# Patient Record
Sex: Male | Born: 1970 | Race: White | Hispanic: No | Smoking: Former smoker
Health system: Southern US, Community
[De-identification: ages and names within clinical notes are randomized; demographics above are authoritative.]

## PROBLEM LIST (undated history)

## (undated) DIAGNOSIS — G4700 Insomnia, unspecified: Secondary | ICD-10-CM

## (undated) DIAGNOSIS — F32A Depression, unspecified: Secondary | ICD-10-CM

## (undated) DIAGNOSIS — F329 Major depressive disorder, single episode, unspecified: Secondary | ICD-10-CM

## (undated) HISTORY — PX: WISDOM TOOTH EXTRACTION: SHX21

## (undated) HISTORY — DX: Depression, unspecified: F32.A

## (undated) HISTORY — PX: NO PAST SURGERIES: SHX2092

## (undated) HISTORY — DX: Major depressive disorder, single episode, unspecified: F32.9

## (undated) HISTORY — DX: Insomnia, unspecified: G47.00

---

## 2005-08-12 ENCOUNTER — Ambulatory Visit: Payer: Self-pay | Admitting: Gastroenterology

## 2005-09-01 ENCOUNTER — Ambulatory Visit: Payer: Self-pay | Admitting: Gastroenterology

## 2005-09-10 ENCOUNTER — Ambulatory Visit: Payer: Self-pay | Admitting: Gastroenterology

## 2016-05-08 ENCOUNTER — Encounter: Payer: Self-pay | Admitting: Family Medicine

## 2016-05-08 ENCOUNTER — Ambulatory Visit (INDEPENDENT_AMBULATORY_CARE_PROVIDER_SITE_OTHER): Payer: BC Managed Care – PPO | Admitting: Family Medicine

## 2016-05-08 VITALS — BP 116/76 | HR 76 | Temp 98.2°F | Resp 16 | Ht 72.0 in | Wt 278.0 lb

## 2016-05-08 DIAGNOSIS — F329 Major depressive disorder, single episode, unspecified: Secondary | ICD-10-CM | POA: Diagnosis not present

## 2016-05-08 DIAGNOSIS — F5101 Primary insomnia: Secondary | ICD-10-CM

## 2016-05-08 DIAGNOSIS — Z0001 Encounter for general adult medical examination with abnormal findings: Secondary | ICD-10-CM | POA: Insufficient documentation

## 2016-05-08 DIAGNOSIS — Z0000 Encounter for general adult medical examination without abnormal findings: Secondary | ICD-10-CM

## 2016-05-08 DIAGNOSIS — F419 Anxiety disorder, unspecified: Secondary | ICD-10-CM

## 2016-05-08 DIAGNOSIS — G4700 Insomnia, unspecified: Secondary | ICD-10-CM | POA: Insufficient documentation

## 2016-05-08 DIAGNOSIS — F32A Depression, unspecified: Secondary | ICD-10-CM | POA: Insufficient documentation

## 2016-05-08 LAB — LIPID PANEL
CHOLESTEROL: 166 mg/dL (ref 0–200)
HDL: 35.5 mg/dL — ABNORMAL LOW (ref >39.00)
LDL CALC: 104 mg/dL — AB (ref 0–99)
NONHDL: 130.4
Total CHOL/HDL Ratio: 5
Triglycerides: 132 mg/dL (ref 0.0–149.0)
VLDL: 26.4 mg/dL (ref 0.0–40.0)

## 2016-05-08 LAB — HEMOGLOBIN A1C: HEMOGLOBIN A1C: 5.5 % (ref 4.6–6.5)

## 2016-05-08 LAB — COMPREHENSIVE METABOLIC PANEL
ALBUMIN: 4.4 g/dL (ref 3.5–5.2)
ALK PHOS: 56 U/L (ref 39–117)
ALT: 67 U/L — ABNORMAL HIGH (ref 0–53)
AST: 32 U/L (ref 0–37)
BUN: 9 mg/dL (ref 6–23)
CHLORIDE: 104 meq/L (ref 96–112)
CO2: 30 mEq/L (ref 19–32)
CREATININE: 0.9 mg/dL (ref 0.40–1.50)
Calcium: 9.6 mg/dL (ref 8.4–10.5)
GFR: 97.36 mL/min (ref >60.00)
GLUCOSE: 90 mg/dL (ref 70–99)
POTASSIUM: 4.8 meq/L (ref 3.5–5.1)
SODIUM: 139 meq/L (ref 135–145)
TOTAL PROTEIN: 6.6 g/dL (ref 6.0–8.3)
Total Bilirubin: 0.5 mg/dL (ref 0.2–1.2)

## 2016-05-08 LAB — CBC
HCT: 45.7 % (ref 39.0–52.0)
Hemoglobin: 15.5 g/dL (ref 13.0–17.0)
MCHC: 33.8 g/dL (ref 30.0–36.0)
MCV: 88.6 fl (ref 78.0–100.0)
PLATELETS: 232 10*3/uL (ref 150.0–400.0)
RBC: 5.16 Mil/uL (ref 4.22–5.81)
RDW: 12.9 % (ref 11.5–15.5)
WBC: 6.4 10*3/uL (ref 4.0–10.5)

## 2016-05-08 LAB — TSH: TSH: 0.86 u[IU]/mL (ref 0.35–4.50)

## 2016-05-08 MED ORDER — TRAZODONE HCL 50 MG PO TABS: ORAL_TABLET | ORAL | 3 refills | Status: DC

## 2016-05-08 MED ORDER — ESCITALOPRAM OXALATE 20 MG PO TABS: 30.0000 mg | ORAL_TABLET | Freq: Every day | ORAL | 3 refills | Status: DC

## 2016-05-08 NOTE — Progress Notes (Signed)
Subjective:  Patient ID: KHA ORBIN, male    DOB: [birth year]  Age: 45 y.o. MRN: [id number]  CC: Establish care, physical  HPI Connor Byrd Byrd is a 45 y.o. male presents to the clinic today to establish care. He is in need of a physical exam.  Preventative Healthcare  Immunizations  Tetanus - Unsure.   Pneumococcal - Not indicated. Has quit smoking.  Flu - Declines.   Labs: Screening labs today.  Exercise: Reports regular exercise. Has gained weight after quitting smoking.  Alcohol use: See below.  Smoking/tobacco use: Former smoker.   Regular dental exams: Yes.   Wears seat belt: Yes.   PMH, Surgical Hx, Family Hx, Social History reviewed and updated as below.  Past Medical History:  Diagnosis Date  . Depression   . Insomnia    Past Surgical History:  Procedure Laterality Date  . NO PAST SURGERIES     Family History  Problem Relation Age of Onset  . Alcohol abuse Mother   . Arthritis Mother   . Mental illness Mother   . Hyperlipidemia Father   . Hypertension Father   . Diabetes Father   . Mental illness Maternal Grandmother   . Diabetes Maternal Grandfather   . Alcohol abuse Paternal Grandfather    Social History  Substance Use Topics  . Smoking status: Former Smoker    Quit date: 01/03/2016  . Smokeless tobacco: Never Used  . Alcohol use 1.2 - 2.4 oz/week    2 - 4 Standard drinks or equivalent per week   Review of Systems General: Denies unexplained weight loss, fever. Skin: Denies new or changing mole, sore/wound that won't heal. ENT: Trouble hearing, ringing in the ears, sores in the mouth, hoarseness, trouble swallowing. Eyes: Denies trouble seeing/visual disturbance. Heart/CV: Denies chest pain, shortness of breath, edema, palpitations. Lungs/Resp: Denies cough, shortness of breath, hemoptysis. Abd/GI: Denies nausea, vomiting, diarrhea, constipation, abdominal pain, hematochezia, melena. GU: Denies dysuria, incontinence, hematuria,  urinary frequency, difficulty starting/keeping stream, penile discharge, sexual difficulty, lump in testicles. MSK: Denies joint pain/swelling, myalgias. Neuro: Denies headaches, weakness, numbness, dizziness, syncope. Psych: Denies sadness, anxiety, stress, memory difficulty. Endocrine: Denies polyuria and polydipsia.  Objective:   Today's Vitals: BP 116/76 (BP Location: Left Arm, Patient Position: Sitting, Cuff Size: Large)   Pulse 76   Temp 98.2 F (36.8 C) (Oral)   Resp 16   Ht 6' (1.829 m) Comment: with shoes  Wt 278 lb (126.1 kg)   SpO2 96%   BMI 37.70 kg/m   Physical Exam  Constitutional: He is oriented to person, place, and time. He appears well-developed and well-nourished. No distress.  Obese.  HENT:  Head: Normocephalic and atraumatic.  Nose: Nose normal.  Mouth/Throat: Oropharynx is clear and moist. No oropharyngeal exudate.  Normal TM's bilaterally.   Eyes: Conjunctivae are normal. No scleral icterus.  Neck: Neck supple.  Cardiovascular: Normal rate and regular rhythm.   No murmur heard. Pulmonary/Chest: Effort normal and breath sounds normal. He has no wheezes. He has no rales.  Abdominal: Soft. He exhibits no distension. There is no tenderness. There is no rebound and no guarding.  Musculoskeletal: Normal range of motion. He exhibits no edema.  Lymphadenopathy:    He has no cervical adenopathy.  Neurological: He is alert and oriented to person, place, and time.  Skin: Skin is warm and dry. No rash noted.  Psychiatric: He has a normal mood and affect.  Vitals reviewed.  Assessment & Plan:   Problem List Items  Addressed This Visit    Insomnia   Relevant Medications   traZODone (DESYREL) 50 MG tablet   Depression   Relevant Medications   traZODone (DESYREL) 50 MG tablet   escitalopram (LEXAPRO) 20 MG tablet   Annual physical exam - Primary    Unsure of tetanus. Declines influenza. Screening labs today. Advised regular exercise and dietary changes.  Advised increased protein and tobacco intake and decreased carb intake. Maintenance medications refill today.      Relevant Orders   CBC   Hemoglobin A1c   Comprehensive metabolic panel   Lipid panel   TSH      Outpatient Encounter Prescriptions as of 05/08/2016  Medication Sig  . escitalopram (LEXAPRO) 20 MG tablet Take 1.5 tablets (30 mg total) by mouth daily.  Marland Kitchen OVER THE COUNTER MEDICATION   . traZODone (DESYREL) 50 MG tablet 1-3 tablets as needed nightly for sleep.  . [DISCONTINUED] escitalopram (LEXAPRO) 20 MG tablet Take 20 mg by mouth daily. Take one and one-half tablets daily  . [DISCONTINUED] traZODone (DESYREL) 50 MG tablet Take 50 mg by mouth 3 (three) times daily. Take one to three tablets at bedtime.   No facility-administered encounter medications on file as of 05/08/2016.     Follow-up: Annually  [name]

## 2016-05-08 NOTE — Patient Instructions (Signed)
Follow up annually.  Diet and exercise as we discussed.  Take care  Dr. Lacinda Axon  Health Maintenance, Male A healthy lifestyle and preventative care can promote health and wellness.  Maintain regular health, dental, and eye exams.  Eat a healthy diet. Foods like vegetables, fruits, whole grains, low-fat dairy products, and lean protein foods contain the nutrients you need and are low in calories. Decrease your intake of foods high in solid fats, added sugars, and salt. Get information about a proper diet from your health care provider, if necessary.  Regular physical exercise is one of the most important things you can do for your health. Most adults should get at least 150 minutes of moderate-intensity exercise (any activity that increases your heart rate and causes you to sweat) each week. In addition, most adults need muscle-strengthening exercises on 2 or more days a week.   Maintain a healthy weight. The body mass index (BMI) is a screening tool to identify possible weight problems. It provides an estimate of body fat based on height and weight. Your health care provider can find your BMI and can help you achieve or maintain a healthy weight. For males 20 years and older:  A BMI below 18.5 is considered underweight.  A BMI of 18.5 to 24.9 is normal.  A BMI of 25 to 29.9 is considered overweight.  A BMI of 30 and above is considered obese.  Maintain normal blood lipids and cholesterol by exercising and minimizing your intake of saturated fat. Eat a balanced diet with plenty of fruits and vegetables. Blood tests for lipids and cholesterol should begin at age 75 and be repeated every 5 years. If your lipid or cholesterol levels are high, you are over age 50, or you are at high risk for heart disease, you may need your cholesterol levels checked more frequently.Ongoing high lipid and cholesterol levels should be treated with medicines if diet and exercise are not working.  If you smoke,  find out from your health care provider how to quit. If you do not use tobacco, do not start.  Lung cancer screening is recommended for adults aged 89-80 years who are at high risk for developing lung cancer because of a history of smoking. A yearly low-dose CT scan of the lungs is recommended for people who have at least a 30-pack-year history of smoking and are current smokers or have quit within the past 15 years. A pack year of smoking is smoking an average of 1 pack of cigarettes a day for 1 year (for example, a 30-pack-year history of smoking could mean smoking 1 pack a day for 30 years or 2 packs a day for 15 years). Yearly screening should continue until the smoker has stopped smoking for at least 15 years. Yearly screening should be stopped for people who develop a health problem that would prevent them from having lung cancer treatment.  If you choose to drink alcohol, do not have more than 2 drinks per day. One drink is considered to be 12 oz (360 mL) of beer, 5 oz (150 mL) of wine, or 1.5 oz (45 mL) of liquor.  Avoid the use of street drugs. Do not share needles with anyone. Ask for help if you need support or instructions about stopping the use of drugs.  High blood pressure causes heart disease and increases the risk of stroke. High blood pressure is more likely to develop in:  People who have blood pressure in the end of the normal range (  100-139/85-89 mm Hg).  People who are overweight or obese.  People who are African American.  If you are 40-59 years of age, have your blood pressure checked every 3-5 years. If you are 27 years of age or older, have your blood pressure checked every year. You should have your blood pressure measured twice-once when you are at a hospital or clinic, and once when you are not at a hospital or clinic. Record the average of the two measurements. To check your blood pressure when you are not at a hospital or clinic, you can use:  An automated blood  pressure machine at a pharmacy.  A home blood pressure monitor.  If you are 35-82 years old, ask your health care provider if you should take aspirin to prevent heart disease.  Diabetes screening involves taking a blood sample to check your fasting blood sugar level. This should be done once every 3 years after age 18 if you are at a normal weight and without risk factors for diabetes. Testing should be considered at a younger age or be carried out more frequently if you are overweight and have at least 1 risk factor for diabetes.  Colorectal cancer can be detected and often prevented. Most routine colorectal cancer screening begins at the age of 59 and continues through age 71. However, your health care provider may recommend screening at an earlier age if you have risk factors for colon cancer. On a yearly basis, your health care provider may provide home test kits to check for hidden blood in the stool. A small camera at the end of a tube may be used to directly examine the colon (sigmoidoscopy or colonoscopy) to detect the earliest forms of colorectal cancer. Talk to your health care provider about this at age 53 when routine screening begins. A direct exam of the colon should be repeated every 5-10 years through age 51, unless early forms of precancerous polyps or small growths are found.  People who are at an increased risk for hepatitis B should be screened for this virus. You are considered at high risk for hepatitis B if:  You were born in a country where hepatitis B occurs often. Talk with your health care provider about which countries are considered high risk.  Your parents were born in a high-risk country and you have not received a shot to protect against hepatitis B (hepatitis B vaccine).  You have HIV or AIDS.  You use needles to inject street drugs.  You live with, or have sex with, someone who has hepatitis B.  You are a man who has sex with other men (MSM).  You get  hemodialysis treatment.  You take certain medicines for conditions like cancer, organ transplantation, and autoimmune conditions.  Hepatitis C blood testing is recommended for all people born from [birth year] through 1964 and any individual with known risk factors for hepatitis C.  Healthy men should no longer receive prostate-specific antigen (PSA) blood tests as part of routine cancer screening. Talk to your health care provider about prostate cancer screening.  Testicular cancer screening is not recommended for adolescents or adult males who have no symptoms. Screening includes self-exam, a health care provider exam, and other screening tests. Consult with your health care provider about any symptoms you have or any concerns you have about testicular cancer.  Practice safe sex. Use condoms and avoid high-risk sexual practices to reduce the spread of sexually transmitted infections (STIs).  You should be screened for STIs, including  gonorrhea and chlamydia if:  You are sexually active and are younger than 24 years.  You are older than 24 years, and your health care provider tells you that you are at risk for this type of infection.  Your sexual activity has changed since you were last screened, and you are at an increased risk for chlamydia or gonorrhea. Ask your health care provider if you are at risk.  If you are at risk of being infected with HIV, it is recommended that you take a prescription medicine daily to prevent HIV infection. This is called pre-exposure prophylaxis (PrEP). You are considered at risk if:  You are a man who has sex with other men (MSM).  You are a heterosexual man who is sexually active with multiple partners.  You take drugs by injection.  You are sexually active with a partner who has HIV.  Talk with your health care provider about whether you are at high risk of being infected with HIV. If you choose to begin PrEP, you should first be tested for HIV. You should  then be tested every 3 months for as long as you are taking PrEP.  Use sunscreen. Apply sunscreen liberally and repeatedly throughout the day. You should seek shade when your shadow is shorter than you. Protect yourself by wearing long sleeves, pants, a wide-brimmed hat, and sunglasses year round whenever you are outdoors.  Tell your health care provider of new moles or changes in moles, especially if there is a change in shape or color. Also, tell your health care provider if a mole is larger than the size of a pencil eraser.  A one-time screening for abdominal aortic aneurysm (AAA) and surgical repair of large AAAs by ultrasound is recommended for men aged 33-75 years who are current or former smokers.  Stay current with your vaccines (immunizations). This information is not intended to replace advice given to you by your health care provider. Make sure you discuss any questions you have with your health care provider. Document Released: 11/22/2007 Document Revised: 06/16/2014 Document Reviewed: 02/27/2015 Elsevier Interactive Patient Education  2017 [employer].

## 2016-05-08 NOTE — Assessment & Plan Note (Signed)
Unsure of tetanus. Declines influenza. Screening labs today. Advised regular exercise and dietary changes. Advised increased protein and tobacco intake and decreased carb intake. Maintenance medications refill today.

## 2016-05-29 ENCOUNTER — Telehealth: Payer: Self-pay

## 2016-05-29 ENCOUNTER — Other Ambulatory Visit: Payer: Self-pay | Admitting: Family Medicine

## 2016-05-29 DIAGNOSIS — R7989 Other specified abnormal findings of blood chemistry: Secondary | ICD-10-CM

## 2016-05-29 DIAGNOSIS — R945 Abnormal results of liver function studies: Principal | ICD-10-CM

## 2016-05-29 NOTE — Telephone Encounter (Signed)
Pt coming for repeat labs 05/30/16. Please place future orders. Thank you.  

## 2016-05-30 ENCOUNTER — Other Ambulatory Visit (INDEPENDENT_AMBULATORY_CARE_PROVIDER_SITE_OTHER): Payer: BC Managed Care – PPO

## 2016-05-30 DIAGNOSIS — R945 Abnormal results of liver function studies: Principal | ICD-10-CM

## 2016-05-30 DIAGNOSIS — R7989 Other specified abnormal findings of blood chemistry: Secondary | ICD-10-CM | POA: Diagnosis not present

## 2016-05-30 LAB — HEPATIC FUNCTION PANEL
ALBUMIN: 4.7 g/dL (ref 3.5–5.2)
ALK PHOS: 55 U/L (ref 39–117)
ALT: 75 U/L — ABNORMAL HIGH (ref 0–53)
AST: 36 U/L (ref 0–37)
Bilirubin, Direct: 0.1 mg/dL (ref 0.0–0.3)
TOTAL PROTEIN: 7.2 g/dL (ref 6.0–8.3)
Total Bilirubin: 0.6 mg/dL (ref 0.2–1.2)

## 2016-06-03 ENCOUNTER — Telehealth: Payer: Self-pay | Admitting: *Deleted

## 2016-06-03 NOTE — Telephone Encounter (Signed)
Pt requested lab results Pt contact [phone]

## 2016-06-03 NOTE — Telephone Encounter (Signed)
Notified patient.

## 2016-06-06 ENCOUNTER — Other Ambulatory Visit: Payer: Self-pay | Admitting: Family Medicine

## 2016-06-06 DIAGNOSIS — R748 Abnormal levels of other serum enzymes: Secondary | ICD-10-CM

## 2016-06-11 ENCOUNTER — Other Ambulatory Visit: Payer: Self-pay | Admitting: Family Medicine

## 2016-06-11 DIAGNOSIS — R748 Abnormal levels of other serum enzymes: Secondary | ICD-10-CM

## 2016-06-17 ENCOUNTER — Ambulatory Visit
Admission: RE | Admit: 2016-06-17 | Discharge: 2016-06-17 | Disposition: A | Discharge status: Home / Self Care | Payer: BC Managed Care – PPO | Source: Ambulatory Visit | Attending: Family Medicine | Admitting: Family Medicine

## 2016-06-17 DIAGNOSIS — R748 Abnormal levels of other serum enzymes: Secondary | ICD-10-CM | POA: Insufficient documentation

## 2016-06-17 DIAGNOSIS — K769 Liver disease, unspecified: Secondary | ICD-10-CM | POA: Diagnosis not present

## 2016-06-18 ENCOUNTER — Telehealth: Payer: Self-pay | Admitting: Family Medicine

## 2016-06-18 NOTE — Telephone Encounter (Signed)
Pt called back in regards to his imaging results. Thank you!  Call pt @ [phone]

## 2016-06-19 NOTE — Telephone Encounter (Signed)
Pt called and was given results. He will callback in regards to CT or MRI.

## 2016-12-01 ENCOUNTER — Other Ambulatory Visit: Payer: Self-pay | Admitting: Family Medicine

## 2016-12-01 DIAGNOSIS — F5101 Primary insomnia: Secondary | ICD-10-CM

## 2017-01-12 ENCOUNTER — Encounter: Payer: Self-pay | Admitting: Family Medicine

## 2017-04-23 ENCOUNTER — Other Ambulatory Visit: Payer: Self-pay | Admitting: Family Medicine

## 2017-04-23 DIAGNOSIS — F5101 Primary insomnia: Secondary | ICD-10-CM

## 2017-05-14 ENCOUNTER — Encounter: Payer: BC Managed Care – PPO | Admitting: Family Medicine

## 2017-05-18 IMAGING — US US ABDOMEN LIMITED
1 series · 15 of 25 positions shown · non-contrast
Comparison: The gallbladder is visualized and no gallstones are
noted. There is no pain over the gallbladder with compression.

CLINICAL DATA: Elevated liver function tests

EXAM:
US ABDOMEN LIMITED - RIGHT UPPER QUADRANT

[Series 1: us abdomen limited · 15 of 63 slices shown]
[im 1/63]
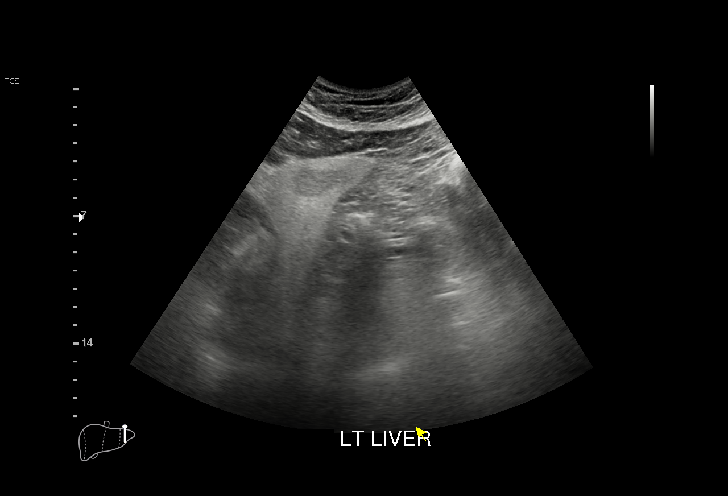
[im 6/63]
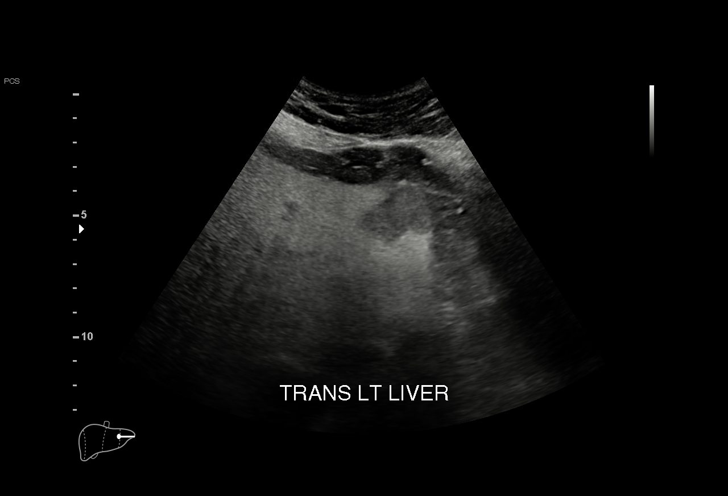
[im 11/63]
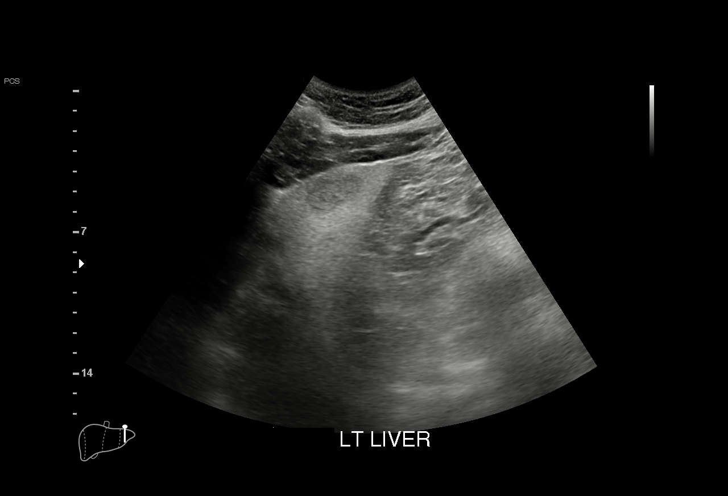
[im 13/63]
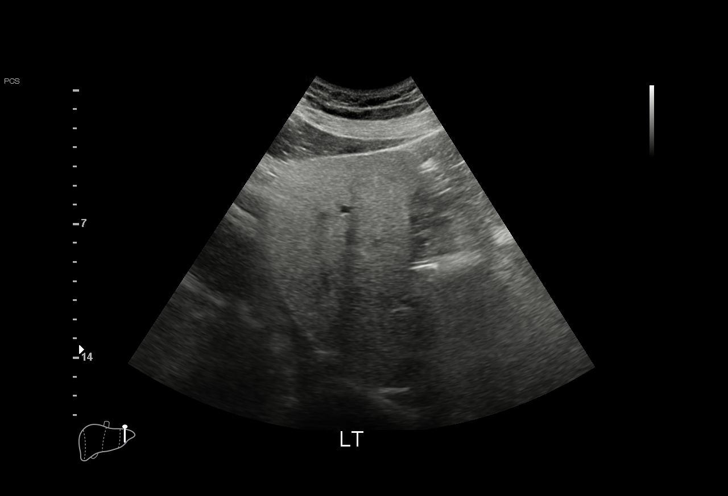
[im 19/63]
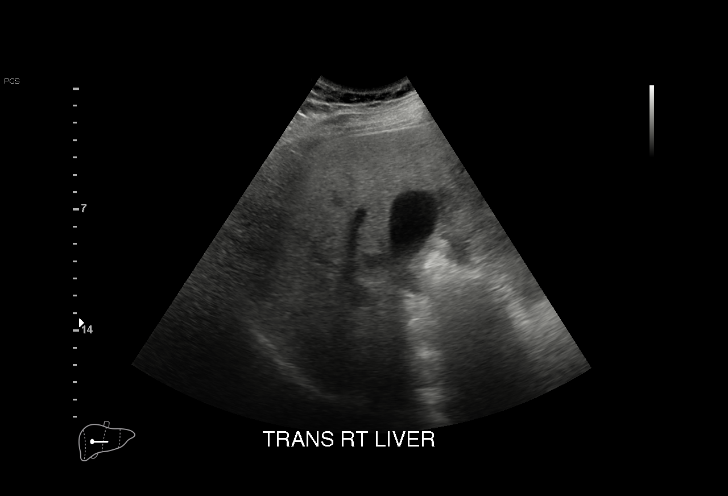
[im 24/63]
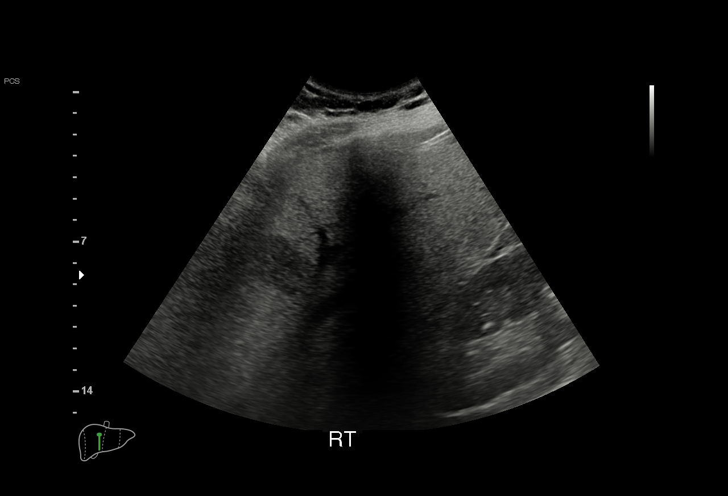
[im 26/63]
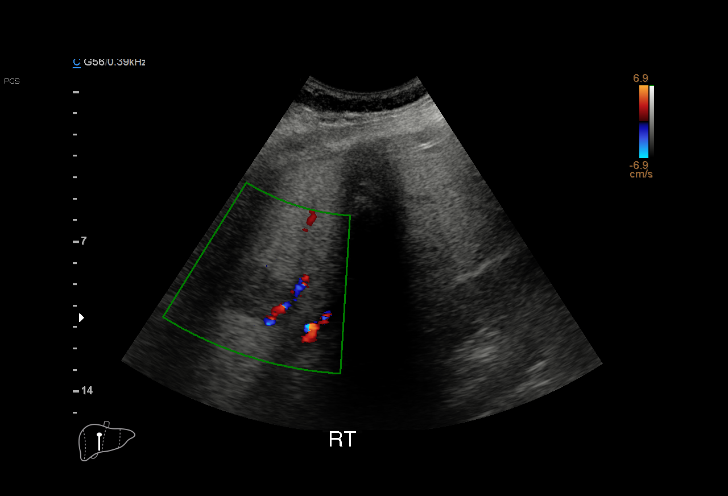
[im 32/63]
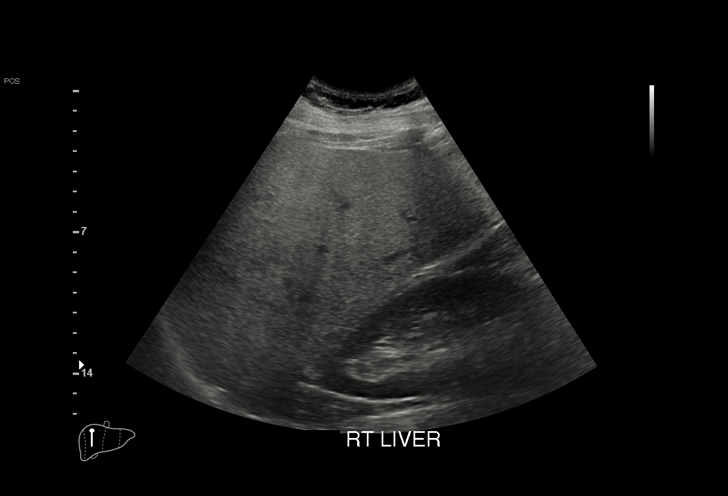
[im 37/63]
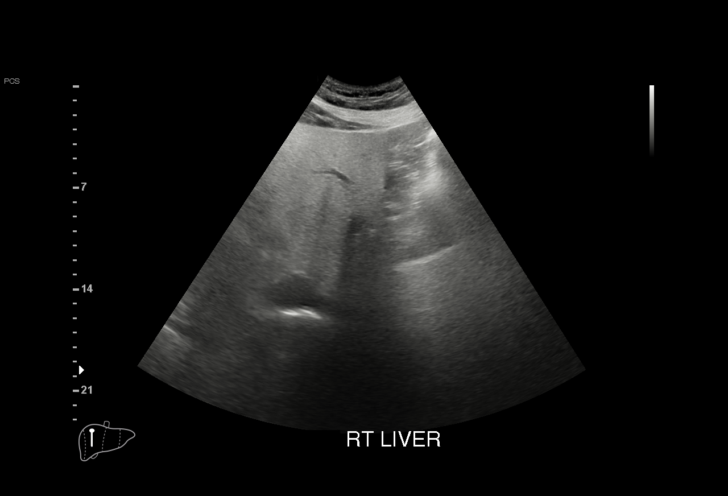
[im 39/63]
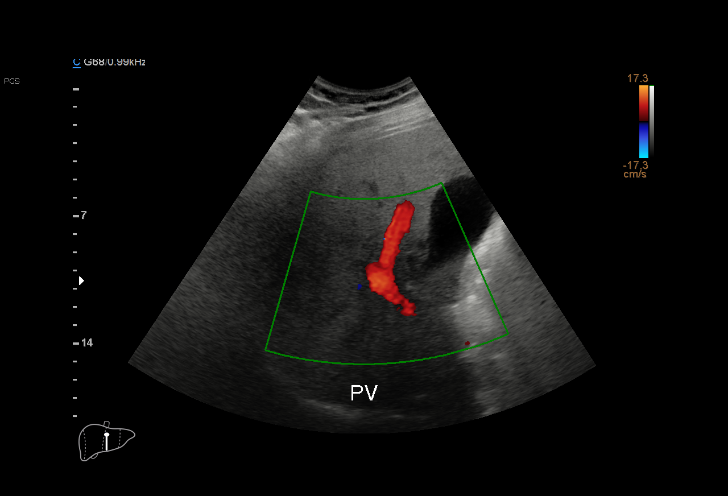
[im 44/63]
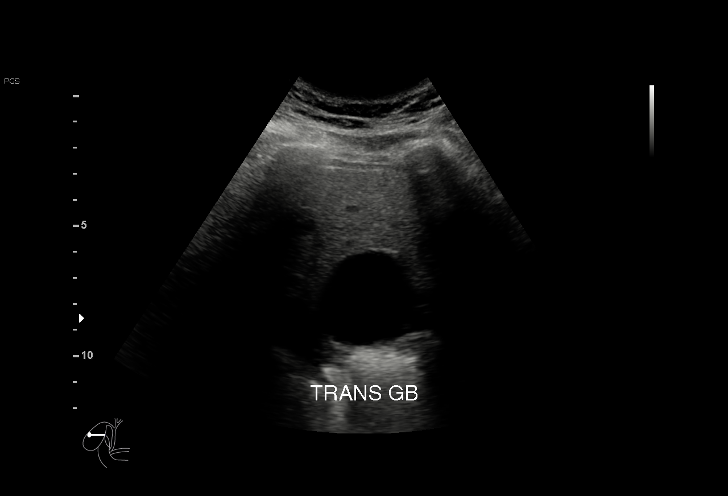
[im 50/63]
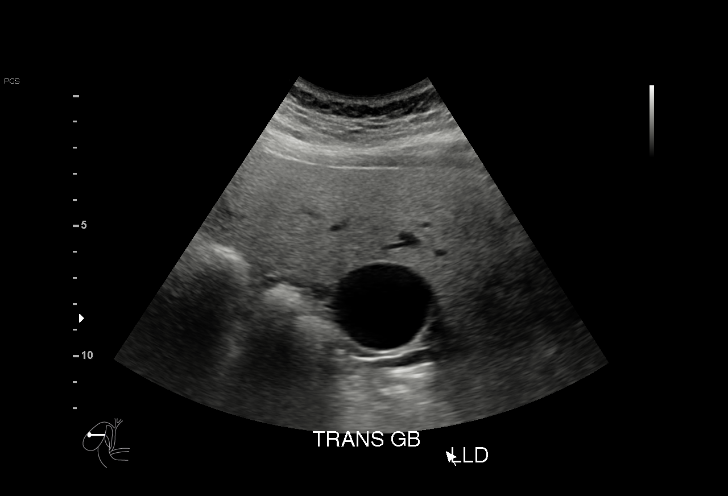
[im 52/63]
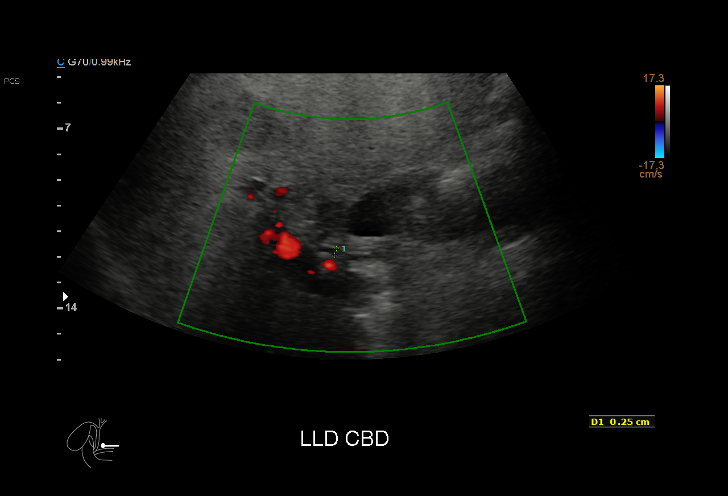
[im 57/63]
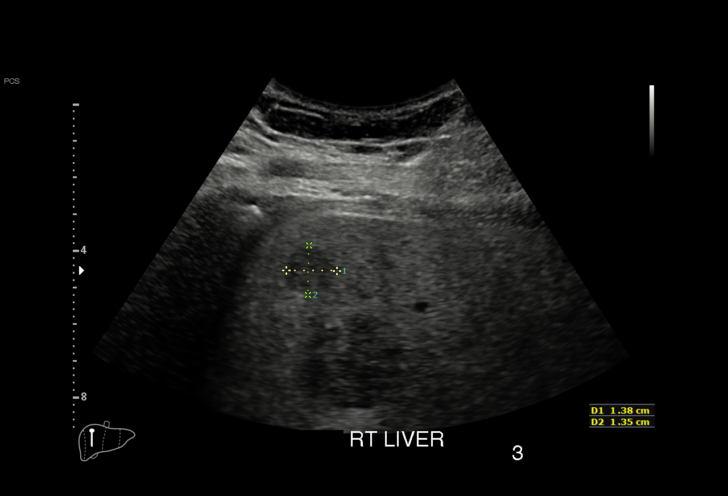
[im 63/63]
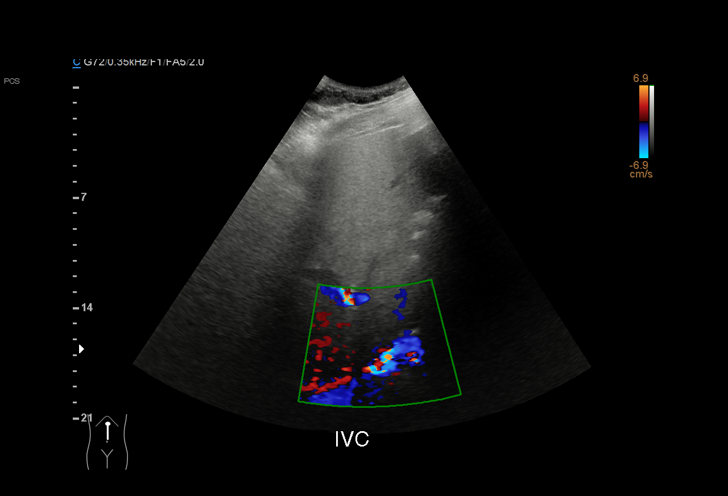

[15 of 25 positions shown; findings below may reference images not displayed]

FINDINGS: Gallbladder:

The gallbladder is well visualized and no gallstones are noted.
There is no pain over the gallbladder with compression.

Common bile duct:

Diameter: The common bile duct is normal measuring 2.7 mm in
diameter.

Liver:

The liver is diffusely echogenic and inhomogeneous consistent with
fatty infiltration. There are 3 areas however which are relatively
hypoechoic to the adjacent hepatic parenchyma. In the left lobe a
lesion measures 3.3 x 1.7 x 4.1 cm. In the right lobe there are 2
such lesions one measuring 3.6 x 2.5 x 3.4 cm, and a second lesion
measuring 1.4 x 1.4 x 1.5 cm. These could represent areas of fatty
sparing, but discrete hepatic lesions cannot be excluded. CT of the
abdomen without and with contrast or MRI of the abdomen is
recommended to assess the liver in more detail..
IMPRESSION: 1. Three relatively hypoechoic liver lesions compared to the
inhomogeneous echogenic liver parenchyma. These could represent
fatty sparing in the background of diffuse fatty infiltration, but
distinct liver lesions cannot be excluded. Recommend CT or MRI of
the liver to assess further.
2. No gallstones.

## 2017-05-27 ENCOUNTER — Encounter: Payer: Self-pay | Admitting: Family Medicine

## 2017-05-27 ENCOUNTER — Other Ambulatory Visit: Payer: Self-pay

## 2017-05-27 ENCOUNTER — Ambulatory Visit (INDEPENDENT_AMBULATORY_CARE_PROVIDER_SITE_OTHER): Payer: BC Managed Care – PPO | Admitting: Family Medicine

## 2017-05-27 VITALS — BP 128/80 | HR 73 | Temp 98.3°F | Ht 72.0 in | Wt 280.0 lb

## 2017-05-27 DIAGNOSIS — F32A Depression, unspecified: Secondary | ICD-10-CM

## 2017-05-27 DIAGNOSIS — F5101 Primary insomnia: Secondary | ICD-10-CM

## 2017-05-27 DIAGNOSIS — Z23 Encounter for immunization: Secondary | ICD-10-CM

## 2017-05-27 DIAGNOSIS — F329 Major depressive disorder, single episode, unspecified: Secondary | ICD-10-CM | POA: Diagnosis not present

## 2017-05-27 DIAGNOSIS — Z0001 Encounter for general adult medical examination with abnormal findings: Secondary | ICD-10-CM | POA: Diagnosis not present

## 2017-05-27 DIAGNOSIS — E669 Obesity, unspecified: Secondary | ICD-10-CM | POA: Diagnosis not present

## 2017-05-27 DIAGNOSIS — Z13220 Encounter for screening for lipoid disorders: Secondary | ICD-10-CM

## 2017-05-27 DIAGNOSIS — N402 Nodular prostate without lower urinary tract symptoms: Secondary | ICD-10-CM | POA: Diagnosis not present

## 2017-05-27 DIAGNOSIS — N529 Male erectile dysfunction, unspecified: Secondary | ICD-10-CM | POA: Insufficient documentation

## 2017-05-27 MED ORDER — TRAZODONE HCL 50 MG PO TABS: ORAL_TABLET | ORAL | 3 refills | Status: DC

## 2017-05-27 MED ORDER — ESCITALOPRAM OXALATE 20 MG PO TABS: 30.0000 mg | ORAL_TABLET | Freq: Every day | ORAL | 3 refills | Status: DC

## 2017-05-27 NOTE — Assessment & Plan Note (Signed)
Physical exam completed.  Encouraged diet and exercise.  He will return for fasting lab work.  Tetanus vaccination and flu vaccination given to patient.

## 2017-05-27 NOTE — Patient Instructions (Signed)
Nice to see you. Please contact your insurance company to ensure that they will cover the referral to urology for the prostate nodule with a code of N40.2.  This code will also be used for a PSA.  We are going to check a testosterone as well for erectile dysfunction with a code of N52.9.  We will check a lipid panel and CMP with a code of Z13.220. Please work on diet and exercise.

## 2017-05-27 NOTE — Assessment & Plan Note (Signed)
Prostate exam completed given erectile dysfunction issues.  Possible nodule noted in the right side of the prostate.  We will check a PSA.  We will refer to urology.  I advised the patient to check with his insurance to ensure that they will cover this given he has concerns regarding this.

## 2017-05-27 NOTE — Assessment & Plan Note (Signed)
Asymptomatic.  No SI.  Refill Lexapro.

## 2017-05-27 NOTE — Assessment & Plan Note (Signed)
No obvious genital abnormalities.  We will check a testosterone level given that he is relatively young for erectile issues.  We will also refer to urology given possible prostate nodule.

## 2017-05-27 NOTE — Progress Notes (Signed)
Tommi Rumps, MD Phone: [phone]  Connor Byrd is a 46 y.o. male who presents today for follow-up.  Not exercising very much.  He plans to start at the first of the year with walking. Is going to work on diet as well.  He has been very unhealthy recently. HIV testing done 15 years ago.  He is due for tetanus vaccination and flu shot. No family history of prostate cancer or colon cancer. Quit smoking 2 years ago after smoking 1.5-2 packs/day for 30 years.  Rare alcohol use.  No illicit drug use.  Patient reports some issues with sexual dysfunction.  His erections are not as direct as prior and do not last as long.  His nighttime erections are similar.  He does ejaculate okay.  Insomnia: Patient notes he sleeps very well with the trazodone.  He typically takes it nightly.  No drowsiness the next day.  Depression: He notes no depressive symptoms or anxiety symptoms.  The Lexapro has been very beneficial.  No SI.  Active Ambulatory Problems    Diagnosis Date Noted  . Insomnia   . Depression   . Encounter for general adult medical examination with abnormal findings 05/08/2016  . Prostate nodule 05/27/2017  . Erectile dysfunction 05/27/2017   Resolved Ambulatory Problems    Diagnosis Date Noted  . No Resolved Ambulatory Problems   Past Medical History:  Diagnosis Date  . Depression   . Insomnia     Family History  Problem Relation Age of Onset  . Alcohol abuse Mother   . Arthritis Mother   . Mental illness Mother   . Hyperlipidemia Father   . Hypertension Father   . Diabetes Father   . Mental illness Maternal Grandmother   . Diabetes Maternal Grandfather   . Alcohol abuse Paternal Grandfather     Social History   Socioeconomic History  . Marital status: Married    Spouse name: Not on file  . Number of children: Not on file  . Years of education: Not on file  . Highest education level: Not on file  Social Needs  . Financial resource strain: Not on file    . Food insecurity - worry: Not on file  . Food insecurity - inability: Not on file  . Transportation needs - medical: Not on file  . Transportation needs - non-medical: Not on file  Occupational History  . Not on file  Tobacco Use  . Smoking status: Former Smoker    Last attempt to quit: 01/03/2016    Years since quitting: 1.3  . Smokeless tobacco: Never Used  Substance and Sexual Activity  . Alcohol use: Yes    Alcohol/week: 1.2 - 2.4 oz    Types: 2 - 4 Standard drinks or equivalent per week  . Drug use: Yes    Types: Marijuana    Comment: only once  . Sexual activity: Yes    Partners: Female  Other Topics Concern  . Not on file  Social History Narrative  . Not on file    ROS  General:  Negative for nexplained weight loss, fever Skin: Negative for new or changing mole, sore that won't heal HEENT: Negative for trouble hearing, trouble seeing, ringing in ears, mouth sores, hoarseness, change in voice, dysphagia. CV:  Negative for chest pain, dyspnea, edema, palpitations Resp: Negative for cough, dyspnea, hemoptysis GI: Negative for nausea, vomiting, diarrhea, constipation, abdominal pain, melena, hematochezia. GU: Positive for erectile dysfunction, negative for dysuria, incontinence, urinary hesitance, hematuria, vaginal or  penile discharge, polyuria, lumps in testicle or breasts MSK: Negative for muscle cramps or aches, joint pain or swelling Neuro: Negative for headaches, weakness, numbness, dizziness, passing out/fainting Psych: Negative for depression, anxiety, memory problems  Objective  Physical Exam Vitals:   05/27/17 1053  BP: 128/80  Pulse: 73  Temp: 98.3 F (36.8 C)  SpO2: 95%    BP Readings from Last 3 Encounters:  05/27/17 128/80  05/08/16 116/76   Wt Readings from Last 3 Encounters:  05/27/17 280 lb (127 kg)  05/08/16 278 lb (126.1 kg)    Physical Exam  Constitutional: No distress.  HENT:  Head: Normocephalic and atraumatic.  Mouth/Throat:  Oropharynx is clear and moist. No oropharyngeal exudate.  Eyes: Conjunctivae are normal. Pupils are equal, round, and reactive to light.  Neck: Neck supple.  Cardiovascular: Normal rate, regular rhythm and normal heart sounds.  Pulmonary/Chest: Effort normal and breath sounds normal.  Genitourinary:  Genitourinary Comments: Normal circumcised penis, normal testicles, normal scrotum, normal epididymis, normal vas deferens, no inguinal hernias, prostate exam completed given erectile dysfunction and there is noted to be a possible small nodule versus ridge on the proximal right side  Musculoskeletal: He exhibits no edema.  Lymphadenopathy:    He has no cervical adenopathy.  Neurological: He is alert. Gait normal.  Skin: Skin is warm and dry. He is not diaphoretic.     Assessment/Plan:   Encounter for general adult medical examination with abnormal findings Physical exam completed.  Encouraged diet and exercise.  He will return for fasting lab work.  Tetanus vaccination and flu vaccination given to patient.  Erectile dysfunction No obvious genital abnormalities.  We will check a testosterone level given that he is relatively young for erectile issues.  We will also refer to urology given possible prostate nodule.  Prostate nodule Prostate exam completed given erectile dysfunction issues.  Possible nodule noted in the right side of the prostate.  We will check a PSA.  We will refer to urology.  I advised the patient to check with his insurance to ensure that they will cover this given he has concerns regarding this.  Depression Asymptomatic.  No SI.  Refill Lexapro.  Insomnia Trazodone has been very beneficial.  He takes it nightly.  No drowsiness with this.   Orders Placed This Encounter  Procedures  . Comp Met (CMET)    Standing Status:   Future    Standing Expiration Date:   05/27/2018  . Lipid panel    Standing Status:   Future    Standing Expiration Date:   05/27/2018  .  Testosterone    Standing Status:   Future    Standing Expiration Date:   05/27/2018  . PSA    Standing Status:   Future    Standing Expiration Date:   05/27/2018  . HgB A1c    Standing Status:   Future    Standing Expiration Date:   05/27/2018  . Ambulatory referral to Urology    Referral Priority:   Routine    Referral Type:   Consultation    Referral Reason:   Specialty Services Required    Requested Specialty:   Urology    Number of Visits Requested:   1    Meds ordered this encounter  Medications  . escitalopram (LEXAPRO) 20 MG tablet    Sig: Take 1.5 tablets (30 mg total) by mouth daily.    Dispense:  135 tablet    Refill:  3  . traZODone (DESYREL)  50 MG tablet    Sig: TAKE 1-3 TABLETS BY MOUTH EACH NIGHT AS NEEDED FOR SLEEP    Dispense:  30 tablet    Refill:  [place], MD [place]

## 2017-05-27 NOTE — Addendum Note (Signed)
Addended by: Juanda Chance on: 05/27/2017 11:38 AM   Modules accepted: Orders

## 2017-05-27 NOTE — Assessment & Plan Note (Signed)
Trazodone has been very beneficial.  He takes it nightly.  No drowsiness with this.

## 2017-06-01 ENCOUNTER — Other Ambulatory Visit (INDEPENDENT_AMBULATORY_CARE_PROVIDER_SITE_OTHER): Payer: BC Managed Care – PPO

## 2017-06-01 DIAGNOSIS — Z13220 Encounter for screening for lipoid disorders: Secondary | ICD-10-CM | POA: Diagnosis not present

## 2017-06-01 DIAGNOSIS — N529 Male erectile dysfunction, unspecified: Secondary | ICD-10-CM | POA: Diagnosis not present

## 2017-06-01 DIAGNOSIS — E669 Obesity, unspecified: Secondary | ICD-10-CM | POA: Diagnosis not present

## 2017-06-01 DIAGNOSIS — N402 Nodular prostate without lower urinary tract symptoms: Secondary | ICD-10-CM

## 2017-06-01 LAB — COMPREHENSIVE METABOLIC PANEL
ALBUMIN: 4.7 g/dL (ref 3.5–5.2)
ALK PHOS: 60 U/L (ref 39–117)
ALT: 59 U/L — ABNORMAL HIGH (ref 0–53)
AST: 28 U/L (ref 0–37)
BUN: 11 mg/dL (ref 6–23)
CALCIUM: 9.5 mg/dL (ref 8.4–10.5)
CHLORIDE: 103 meq/L (ref 96–112)
CO2: 29 mEq/L (ref 19–32)
Creatinine, Ser: 0.91 mg/dL (ref 0.40–1.50)
GFR: 95.66 mL/min (ref >60.00)
Glucose, Bld: 94 mg/dL (ref 70–99)
POTASSIUM: 4.7 meq/L (ref 3.5–5.1)
Sodium: 139 mEq/L (ref 135–145)
TOTAL PROTEIN: 7 g/dL (ref 6.0–8.3)
Total Bilirubin: 0.4 mg/dL (ref 0.2–1.2)

## 2017-06-01 LAB — HEMOGLOBIN A1C: Hgb A1c MFr Bld: 5.7 % (ref 4.6–6.5)

## 2017-06-01 LAB — TESTOSTERONE: TESTOSTERONE: 249.82 ng/dL — AB (ref 300.00–890.00)

## 2017-06-01 LAB — LIPID PANEL
CHOLESTEROL: 160 mg/dL (ref 0–200)
HDL: 34.5 mg/dL — AB (ref >39.00)
LDL CALC: 95 mg/dL (ref 0–99)
NonHDL: 125.22
Total CHOL/HDL Ratio: 5
Triglycerides: 150 mg/dL — ABNORMAL HIGH (ref 0.0–149.0)
VLDL: 30 mg/dL (ref 0.0–40.0)

## 2017-06-01 LAB — PSA: PSA: 0.83 ng/mL (ref 0.10–4.00)

## 2017-06-03 ENCOUNTER — Other Ambulatory Visit: Payer: Self-pay | Admitting: Family Medicine

## 2017-06-03 DIAGNOSIS — R7989 Other specified abnormal findings of blood chemistry: Secondary | ICD-10-CM

## 2017-06-19 ENCOUNTER — Other Ambulatory Visit (INDEPENDENT_AMBULATORY_CARE_PROVIDER_SITE_OTHER): Payer: BC Managed Care – PPO

## 2017-06-19 ENCOUNTER — Ambulatory Visit: Payer: BC Managed Care – PPO | Admitting: Urology

## 2017-06-19 ENCOUNTER — Encounter: Payer: Self-pay | Admitting: Urology

## 2017-06-19 VITALS — BP 134/83 | HR 75 | Ht 72.0 in | Wt 279.5 lb

## 2017-06-19 DIAGNOSIS — Z125 Encounter for screening for malignant neoplasm of prostate: Secondary | ICD-10-CM

## 2017-06-19 DIAGNOSIS — N529 Male erectile dysfunction, unspecified: Secondary | ICD-10-CM

## 2017-06-19 DIAGNOSIS — R7989 Other specified abnormal findings of blood chemistry: Secondary | ICD-10-CM

## 2017-06-19 LAB — TESTOSTERONE: Testosterone: 193.16 ng/dL — ABNORMAL LOW (ref 300.00–890.00)

## 2017-06-19 MED ORDER — SILDENAFIL CITRATE 20 MG PO TABS: ORAL_TABLET | ORAL | 6 refills | Status: DC

## 2017-06-19 NOTE — Progress Notes (Signed)
06/19/2017 10:47 AM   Connor Byrd 1971-03-16 [id number]  Referring provider: Coral Spikes, DO [address] [place], [place] [address]  Chief Complaint  Patient presents with  . New Patient (Initial Visit)    prostate nodule    HPI: The patient is a 47 year old gentleman who presents today for evaluation of a prostate nodule.  His PSA was 0.83 in December 2018.  Patient's PCP was concerned about a nodule in the prostate.  The patient also endorses erectile dysfunction.  He is unable to obtain an erection at sufficient to complete intercourse.  At times, he is unable to obtain an erection that is sufficient for penetration.  He has not tried medications for this.  He does not take nitrates.  He has no cardiac issues.  He has been tested for low testosterone which was 250.  He endorses no fatigue.  He does have a minor decrease in libido.  No significant recent weight gain or muscle loss.   PMH: Past Medical History:  Diagnosis Date  . Depression   . Insomnia     Surgical History: Past Surgical History:  Procedure Laterality Date  . NO PAST SURGERIES    . WISDOM TOOTH EXTRACTION      Home Medications:  Allergies as of 06/19/2017   No Known Allergies     Medication List        Accurate as of 06/19/17 10:47 AM. Always use your most recent med list.          escitalopram 20 MG tablet Commonly known as:  LEXAPRO Take 1.5 tablets (30 mg total) by mouth daily.   sildenafil 20 MG tablet Commonly known as:  REVATIO Take 1 to 5 tabs PO daily prn   traZODone 50 MG tablet Commonly known as:  DESYREL TAKE 1-3 TABLETS BY MOUTH EACH NIGHT AS NEEDED FOR SLEEP       Allergies: No Known Allergies  Family History: Family History  Problem Relation Age of Onset  . Alcohol abuse Mother   . Arthritis Mother   . Mental illness Mother   . Hyperlipidemia Father   . Hypertension Father   . Diabetes Father   . Mental illness Maternal Grandmother   .  Diabetes Maternal Grandfather   . Alcohol abuse Paternal Grandfather   . Prostate cancer Neg Hx   . Bladder Cancer Neg Hx   . Kidney cancer Neg Hx     Social History:  reports that he quit smoking about 17 months ago. he has never used smokeless tobacco. He reports that he drinks about 1.2 - 2.4 oz of alcohol per week. He reports that he uses drugs. Drug: Marijuana.  ROS: UROLOGY Frequent Urination?: No Hard to postpone urination?: No Burning/pain with urination?: No Get up at night to urinate?: No Leakage of urine?: No Urine stream starts and stops?: No Trouble starting stream?: No Do you have to strain to urinate?: No Blood in urine?: No Urinary tract infection?: No Sexually transmitted disease?: No Injury to kidneys or bladder?: No Painful intercourse?: No Weak stream?: No Erection problems?: Yes Penile pain?: No  Gastrointestinal Nausea?: No Vomiting?: No Indigestion/heartburn?: No Diarrhea?: No Constipation?: No  Constitutional Fever: No Night sweats?: No Weight loss?: No Fatigue?: No  Skin Skin rash/lesions?: No Itching?: No  Eyes Blurred vision?: No Double vision?: No  Ears/Nose/Throat Sore throat?: No Sinus problems?: No  Hematologic/Lymphatic Swollen glands?: No Easy bruising?: No  Cardiovascular Leg swelling?: No Chest pain?: No  Respiratory  Cough?: No Shortness of breath?: No  Endocrine Excessive thirst?: No  Musculoskeletal Back pain?: No Joint pain?: No  Neurological Headaches?: No Dizziness?: No  Psychologic Depression?: Yes Anxiety?: Yes  Physical Exam: BP 134/83 (BP Location: Right Arm, Patient Position: Sitting, Cuff Size: Large)   Pulse 75   Ht 6' (1.829 m)   Wt 279 lb 8 oz (126.8 kg)   BMI 37.91 kg/m   Constitutional:  Alert and oriented, No acute distress. HEENT: [place] AT, moist mucus membranes.  Trachea midline, no masses. Cardiovascular: No clubbing, cyanosis, or edema. Respiratory: Normal respiratory effort,  no increased work of breathing. GI: Abdomen is soft, nontender, nondistended, no abdominal masses GU: No CVA tenderness.  Normal phallus.  Testicles equal bilaterally benign.  DRE: 25 gm smooth benign Skin: No rashes, bruises or suspicious lesions. Lymph: No cervical or inguinal adenopathy. Neurologic: Grossly intact, no focal deficits, moving all 4 extremities. Psychiatric: Normal mood and affect.  Laboratory Data: Lab Results  Component Value Date   WBC 6.4 05/08/2016   HGB 15.5 05/08/2016   HCT 45.7 05/08/2016   MCV 88.6 05/08/2016   PLT 232.0 05/08/2016    Lab Results  Component Value Date   CREATININE 0.91 06/01/2017    Lab Results  Component Value Date   PSA 0.83 06/01/2017    Lab Results  Component Value Date   TESTOSTERONE 249.82 (L) 06/01/2017    Lab Results  Component Value Date   HGBA1C 5.7 06/01/2017    Urinalysis No results found for: COLORURINE, APPEARANCEUR, LABSPEC, PHURINE, GLUCOSEU, HGBUR, BILIRUBINUR, KETONESUR, PROTEINUR, UROBILINOGEN, NITRITE, LEUKOCYTESUR  Assessment & Plan:    1.  Erectile dysfunction We will start the patient on generic sildenafil 1-5 tabs daily.  He is warned of the risk of priapism the need for emergent intervention.  He will call the office if this does not work for him otherwise he will follow-up annually.  2.  Prostate cancer screening On my exam today, there is no obvious palpable nodule.  His PSA is normal for his age.  Given his age he probably only needs screening every other year or another possibility would be waiting until the age of 70 to 36 to continue any further screening based on AUA guidelines.  Return in about 1 year (around 06/19/2018).  Nickie Retort, MD  [facility] [address] [place], [place] [address] [phone]

## 2017-06-20 ENCOUNTER — Other Ambulatory Visit: Payer: Self-pay | Admitting: Family Medicine

## 2017-06-20 DIAGNOSIS — R7989 Other specified abnormal findings of blood chemistry: Secondary | ICD-10-CM

## 2017-09-17 ENCOUNTER — Other Ambulatory Visit: Payer: Self-pay | Admitting: Family Medicine

## 2017-09-17 DIAGNOSIS — F5101 Primary insomnia: Secondary | ICD-10-CM

## 2017-09-23 NOTE — Telephone Encounter (Signed)
Judeen Hammans w/[employer] [phone] stated they have been trying to get this patient's medication refilled for a week now and he is out of it.  Please approve ASAP. She also stated they can take approval over the telephone.

## 2017-11-25 ENCOUNTER — Encounter: Payer: Self-pay | Admitting: Family Medicine

## 2017-11-25 ENCOUNTER — Ambulatory Visit: Payer: BC Managed Care – PPO | Admitting: Family Medicine

## 2017-11-25 DIAGNOSIS — F329 Major depressive disorder, single episode, unspecified: Secondary | ICD-10-CM | POA: Diagnosis not present

## 2017-11-25 DIAGNOSIS — F5101 Primary insomnia: Secondary | ICD-10-CM | POA: Diagnosis not present

## 2017-11-25 DIAGNOSIS — E669 Obesity, unspecified: Secondary | ICD-10-CM | POA: Insufficient documentation

## 2017-11-25 DIAGNOSIS — F419 Anxiety disorder, unspecified: Secondary | ICD-10-CM

## 2017-11-25 MED ORDER — TRAZODONE HCL 50 MG PO TABS: ORAL_TABLET | ORAL | 1 refills | Status: DC

## 2017-11-25 NOTE — Patient Instructions (Signed)
Nice to see you. I have refilled your trazodone. Good luck to you and your wife tomorrow at her doctor's visit. When you are able to get back to working on diet and exercise please do so though it is fine to focus on your wife right now.

## 2017-11-25 NOTE — Assessment & Plan Note (Signed)
No depression currently.  He does have some mild anxiety related to his wife's current situation.  He will monitor this.  He will continue his current medications.

## 2017-11-25 NOTE — Assessment & Plan Note (Signed)
Adequately controlled.  Continue trazodone.

## 2017-11-25 NOTE — Progress Notes (Signed)
  Tommi Rumps, MD Phone: [phone]  Connor Byrd is a 47 y.o. male who presents today for f/u.  CC: anxiety/depression, obesity, insomnia  Depression/anxiety: Patient notes no depression currently.  He does note more anxiety than usual.  His wife broke her leg and it ended up getting infected and she has had the rods were placed 3 times.  He notes she has been getting better though this has been months of issues with this.  He notes no SI.  He is on Lexapro and trazodone.  Obesity: He has not been able to work on diet and exercise given what has been going on with his wife.  He does note his blood pressure is typically lower than it is today.  He notes no chest pain or shortness of breath.  He has been making bad meals.  Insomnia: He takes trazodone for this.  Will take 1 when he goes to bed and then if he is still awake will take another one at 1 AM.  He only takes the extra trazodone about 2-3 times a month.  He will get 7 to 8 hours of sleep nightly.  He notes it does not make him drowsy the next day.  Social History   Tobacco Use  Smoking Status Former Smoker  . Last attempt to quit: 01/03/2016  . Years since quitting: 1.8  Smokeless Tobacco Never Used     ROS see history of present illness  Objective  Physical Exam Vitals:   11/25/17 0826  BP: 126/90  Pulse: 79  Temp: 98.5 F (36.9 C)  SpO2: 96%    BP Readings from Last 3 Encounters:  11/25/17 126/90  06/19/17 134/83  05/27/17 128/80   Wt Readings from Last 3 Encounters:  11/25/17 281 lb 3.2 oz (127.6 kg)  06/19/17 279 lb 8 oz (126.8 kg)  05/27/17 280 lb (127 kg)    Physical Exam  Constitutional: No distress.  Cardiovascular: Normal rate, regular rhythm and normal heart sounds.  Pulmonary/Chest: Effort normal and breath sounds normal.  Musculoskeletal: He exhibits no edema.  Neurological: He is alert.  Skin: Skin is warm and dry. He is not diaphoretic.     Assessment/Plan: Please see individual  problem list.  Insomnia Adequately controlled.  Continue trazodone.  Anxiety and depression No depression currently.  He does have some mild anxiety related to his wife's current situation.  He will monitor this.  He will continue his current medications.  Obesity (BMI 35.0-39.9 without comorbidity) I discussed given that what his wife is going through it would be okay for him to hold off on working significantly on diet and exercise while caring for her.  Once things stabilize he will start to work on dietary changes and exercise.  Blood pressure is typically lower per his report.  He will monitor and if it starts to run greater than 140/90 consistently let us know.  No orders of the defined types were placed in this encounter.   Meds ordered this encounter  Medications  . traZODone (DESYREL) 50 MG tablet    Sig: TAKE 1 TO 3 TABLETS BY MOUTH EACH NIGHT AS NEEDED FOR SLEEP    Dispense:  90 tablet    Refill:  1     Tommi Rumps, MD [place]

## 2017-11-25 NOTE — Assessment & Plan Note (Addendum)
I discussed given that what his wife is going through it would be okay for him to hold off on working significantly on diet and exercise while caring for her.  Once things stabilize he will start to work on dietary changes and exercise.  Blood pressure is typically lower per his report.  He will monitor and if it starts to run greater than 140/90 consistently let us know.

## 2018-01-06 ENCOUNTER — Other Ambulatory Visit: Payer: Self-pay | Admitting: Family Medicine

## 2018-01-06 DIAGNOSIS — F5101 Primary insomnia: Secondary | ICD-10-CM

## 2018-01-07 NOTE — Telephone Encounter (Signed)
Pt called in to follow up on refill request, pt says that he is completely out of his medication.    [facility]: [facility] [address]

## 2018-01-07 NOTE — Telephone Encounter (Signed)
Trazodone 50mg  tab refill. Pt states he is out of the medication Last Refill:11/25/17 #90 with 1 refill at [place]: 11/1917 PCP: Dr. Christin Bach Pharmacy:Pt requesting for medication to be sent to [facility]

## 2018-01-27 ENCOUNTER — Other Ambulatory Visit: Payer: Self-pay | Admitting: Family Medicine

## 2018-01-27 DIAGNOSIS — F5101 Primary insomnia: Secondary | ICD-10-CM

## 2018-01-28 NOTE — Telephone Encounter (Signed)
Last OV 11/25/17 last filled 01/08/18 30 0rf

## 2018-04-16 ENCOUNTER — Other Ambulatory Visit: Payer: Self-pay | Admitting: Family Medicine

## 2018-04-16 DIAGNOSIS — F5101 Primary insomnia: Secondary | ICD-10-CM

## 2018-05-28 ENCOUNTER — Ambulatory Visit: Payer: BC Managed Care – PPO | Admitting: Family Medicine

## 2018-05-28 ENCOUNTER — Encounter: Payer: Self-pay | Admitting: Family Medicine

## 2018-05-28 VITALS — BP 120/80 | HR 93 | Temp 98.6°F | Ht 72.0 in | Wt 291.6 lb

## 2018-05-28 DIAGNOSIS — J069 Acute upper respiratory infection, unspecified: Secondary | ICD-10-CM | POA: Insufficient documentation

## 2018-05-28 DIAGNOSIS — F329 Major depressive disorder, single episode, unspecified: Secondary | ICD-10-CM

## 2018-05-28 DIAGNOSIS — F32A Depression, unspecified: Secondary | ICD-10-CM

## 2018-05-28 DIAGNOSIS — Z23 Encounter for immunization: Secondary | ICD-10-CM | POA: Diagnosis not present

## 2018-05-28 DIAGNOSIS — F419 Anxiety disorder, unspecified: Secondary | ICD-10-CM | POA: Diagnosis not present

## 2018-05-28 DIAGNOSIS — E669 Obesity, unspecified: Secondary | ICD-10-CM

## 2018-05-28 DIAGNOSIS — F5101 Primary insomnia: Secondary | ICD-10-CM

## 2018-05-28 LAB — COMPREHENSIVE METABOLIC PANEL
ALT: 98 U/L — AB (ref 0–53)
AST: 41 U/L — AB (ref 0–37)
Albumin: 4.5 g/dL (ref 3.5–5.2)
Alkaline Phosphatase: 71 U/L (ref 39–117)
BILIRUBIN TOTAL: 0.3 mg/dL (ref 0.2–1.2)
BUN: 12 mg/dL (ref 6–23)
CO2: 28 meq/L (ref 19–32)
Calcium: 9.1 mg/dL (ref 8.4–10.5)
Chloride: 106 mEq/L (ref 96–112)
Creatinine, Ser: 0.86 mg/dL (ref 0.40–1.50)
GFR: 101.66 mL/min (ref >60.00)
GLUCOSE: 101 mg/dL — AB (ref 70–99)
Potassium: 4.4 mEq/L (ref 3.5–5.1)
Sodium: 140 mEq/L (ref 135–145)
Total Protein: 6.9 g/dL (ref 6.0–8.3)

## 2018-05-28 LAB — LIPID PANEL
CHOL/HDL RATIO: 4
Cholesterol: 151 mg/dL (ref 0–200)
HDL: 36.7 mg/dL — AB (ref >39.00)
LDL Cholesterol: 95 mg/dL (ref 0–99)
NonHDL: 114.04
Triglycerides: 96 mg/dL (ref 0.0–149.0)
VLDL: 19.2 mg/dL (ref 0.0–40.0)

## 2018-05-28 LAB — HEMOGLOBIN A1C: Hgb A1c MFr Bld: 5.7 % (ref 4.6–6.5)

## 2018-05-28 NOTE — Assessment & Plan Note (Signed)
Consistent with a viral illness.  Patient will trial Flonase.  He can take DayQuil and/or NyQuil.  If not improving over the next 7 to 10 days he will contact us for follow-up.

## 2018-05-28 NOTE — Assessment & Plan Note (Signed)
Improved.  Will continue Lexapro.

## 2018-05-28 NOTE — Assessment & Plan Note (Signed)
Stable.  He will continue trazodone.

## 2018-05-28 NOTE — Patient Instructions (Signed)
Nice to see you. Please work on diet and exercise as discussed. Please monitor your respiratory infection and if you do not improve over the next 7 to 10 days please let us know. Please try Flonase for this.

## 2018-05-28 NOTE — Assessment & Plan Note (Signed)
He has a plan in place.  I encouraged him to follow through with this.

## 2018-05-28 NOTE — Progress Notes (Signed)
  Tommi Rumps, MD Phone: [phone]  Connor Byrd is a 47 y.o. male who presents today for f/u.  CC: URI, anxiety/depression, insomnia, obesity  URI: Patient notes symptoms starting yesterday with some sinus congestion with postnasal drip.  Is not blowing much out of his nose.  No fevers.  He is going to pick up some DayQuil and NyQuil.  Anxiety/depression: Patient notes his anxiety is significantly better.  His mother is settled in to a memory care unit.  His wife has recovered from the infection she had previously.  He notes no depression.  No SI.  He continues on Lexapro.  Insomnia: He continues on trazodone.  He gets 7.5 hours of sleep nightly.  He feels like he sleeps well.  No drowsiness.  Obesity: He has a plan in place for diet and exercise.  He is going to start walking on the treadmill in the new year.  He plans to do intermittent fasting as well.  Social History   Tobacco Use  Smoking Status Former Smoker  . Last attempt to quit: 01/03/2016  . Years since quitting: 2.4  Smokeless Tobacco Never Used     ROS see history of present illness  Objective  Physical Exam Vitals:   05/28/18 0833  BP: 120/80  Pulse: 93  Temp: 98.6 F (37 C)  SpO2: 96%    BP Readings from Last 3 Encounters:  05/28/18 120/80  11/25/17 126/90  06/19/17 134/83   Wt Readings from Last 3 Encounters:  05/28/18 291 lb 9.6 oz (132.3 kg)  11/25/17 281 lb 3.2 oz (127.6 kg)  06/19/17 279 lb 8 oz (126.8 kg)    Physical Exam Constitutional:      General: He is not in acute distress.    Appearance: He is not diaphoretic.  HENT:     Head: Normocephalic and atraumatic.     Mouth/Throat:     Mouth: Mucous membranes are moist.     Pharynx: Oropharynx is clear.  Eyes:     Conjunctiva/sclera: Conjunctivae normal.     Pupils: Pupils are equal, round, and reactive to light.  Cardiovascular:     Rate and Rhythm: Normal rate and regular rhythm.     Heart sounds: Normal heart sounds.    Pulmonary:     Effort: Pulmonary effort is normal.     Breath sounds: Normal breath sounds.  Lymphadenopathy:     Cervical: No cervical adenopathy.  Skin:    General: Skin is warm and dry.  Neurological:     Mental Status: He is alert.      Assessment/Plan: Please see individual problem list.  URI (upper respiratory infection) Consistent with a viral illness.  Patient will trial Flonase.  He can take DayQuil and/or NyQuil.  If not improving over the next 7 to 10 days he will contact us for follow-up.  Anxiety and depression Improved.  Will continue Lexapro.  Insomnia Stable.  He will continue trazodone.  Obesity (BMI 35.0-39.9 without comorbidity) He has a plan in place.  I encouraged him to follow through with this.   Health Maintenance: Flu vaccination given.  Orders Placed This Encounter  Procedures  . Flu Vaccine QUAD 6+ mos PF IM (Fluarix Quad PF)  . Lipid panel  . Comp Met (CMET)  . HgB A1c    No orders of the defined types were placed in this encounter.    Tommi Rumps, MD [place]

## 2018-06-11 ENCOUNTER — Telehealth: Payer: Self-pay

## 2018-06-11 ENCOUNTER — Other Ambulatory Visit: Payer: Self-pay | Admitting: Family Medicine

## 2018-06-11 DIAGNOSIS — F32A Depression, unspecified: Secondary | ICD-10-CM

## 2018-06-11 DIAGNOSIS — F329 Major depressive disorder, single episode, unspecified: Secondary | ICD-10-CM

## 2018-06-11 NOTE — Telephone Encounter (Signed)
Copied from [place] [id number]. Topic: General - Call Back - No Documentation >> Jun 11, 2018  9:52 AM Sheran Luz wrote: Reason for CRM: Patient returning call to office, no documentation.   I have spoken with patient. See results note.

## 2018-06-25 ENCOUNTER — Ambulatory Visit: Payer: BC Managed Care – PPO | Admitting: Urology

## 2018-06-25 ENCOUNTER — Other Ambulatory Visit: Payer: Self-pay | Admitting: Family Medicine

## 2018-06-25 DIAGNOSIS — F5101 Primary insomnia: Secondary | ICD-10-CM

## 2018-09-12 ENCOUNTER — Other Ambulatory Visit: Payer: Self-pay | Admitting: Family Medicine

## 2018-09-12 DIAGNOSIS — F32A Depression, unspecified: Secondary | ICD-10-CM

## 2018-09-12 DIAGNOSIS — F5101 Primary insomnia: Secondary | ICD-10-CM

## 2018-09-12 DIAGNOSIS — F329 Major depressive disorder, single episode, unspecified: Secondary | ICD-10-CM

## 2018-12-03 ENCOUNTER — Telehealth: Payer: Self-pay | Admitting: Family Medicine

## 2018-12-03 ENCOUNTER — Encounter: Payer: Self-pay | Admitting: Family Medicine

## 2018-12-03 ENCOUNTER — Other Ambulatory Visit: Payer: Self-pay

## 2018-12-03 ENCOUNTER — Ambulatory Visit (INDEPENDENT_AMBULATORY_CARE_PROVIDER_SITE_OTHER): Payer: BC Managed Care – PPO | Admitting: Family Medicine

## 2018-12-03 DIAGNOSIS — F5101 Primary insomnia: Secondary | ICD-10-CM | POA: Diagnosis not present

## 2018-12-03 DIAGNOSIS — N529 Male erectile dysfunction, unspecified: Secondary | ICD-10-CM

## 2018-12-03 DIAGNOSIS — F32A Depression, unspecified: Secondary | ICD-10-CM

## 2018-12-03 DIAGNOSIS — F419 Anxiety disorder, unspecified: Secondary | ICD-10-CM

## 2018-12-03 DIAGNOSIS — R7989 Other specified abnormal findings of blood chemistry: Secondary | ICD-10-CM | POA: Insufficient documentation

## 2018-12-03 DIAGNOSIS — E669 Obesity, unspecified: Secondary | ICD-10-CM

## 2018-12-03 DIAGNOSIS — F329 Major depressive disorder, single episode, unspecified: Secondary | ICD-10-CM | POA: Diagnosis not present

## 2018-12-03 DIAGNOSIS — R945 Abnormal results of liver function studies: Secondary | ICD-10-CM

## 2018-12-03 MED ORDER — TRAZODONE HCL 50 MG PO TABS: ORAL_TABLET | ORAL | 1 refills | Status: DC

## 2018-12-03 MED ORDER — ESCITALOPRAM OXALATE 20 MG PO TABS: ORAL_TABLET | ORAL | 1 refills | Status: DC

## 2018-12-03 NOTE — Assessment & Plan Note (Signed)
Encouraged continued exercise.  Discussed dietary changes.  Advised against a watermelon and chicken breast diet given the amount of sugar that is in watermelon.  Discussed healthy diet with lean meats and plenty of vegetables with decreasing snack foods and sugary drinks.

## 2018-12-03 NOTE — Telephone Encounter (Signed)
Please let the patient know that after I got off the visit with him that I noted his testosterone had been low previously and we had referred him to endocrinology.  It looks like he never saw them.  This is something that I would like for him to complete.  I know there are financial constraints at this time with his insurance.  Please see if he would be interested in seeing endocrinology and see if he would be willing to be referred again at this time or if he would want to wait until his insurance is straightened out.

## 2018-12-03 NOTE — Telephone Encounter (Signed)
I called pt and left a vm to call ofc to schedule f/u in 6 month.

## 2018-12-03 NOTE — Assessment & Plan Note (Signed)
Likely fatty liver related.  Patient is unable to afford any additional referrals or imaging at this time due to insurance constraints and financial constraints.  I advised that I would like to have an ultrasound completed and refer him to GI for further evaluation of this and he will let us know when he is able to do this financially.

## 2018-12-03 NOTE — Telephone Encounter (Signed)
Lmtcb.  Saraia Platner,cma

## 2018-12-03 NOTE — Assessment & Plan Note (Addendum)
He has seen urology.  I advised that he should check with the pharmacy regarding refills of the sildenafil and if needed we can send some in for him to use.  He is having issues with insurance currently and is having trouble affording visits and testing.  Patient will likely need to follow-up with urology in the future.

## 2018-12-03 NOTE — Assessment & Plan Note (Signed)
Previously low.  This was noted after he was off of the virtual visit.  He was referred to endocrinology though never scheduled an appointment.  Did see urology though no evaluation or management was completed.  I will have the [facility] contact him to see if he would like to see endocrinology or wait until his insurance issues are sorted out.

## 2018-12-03 NOTE — Progress Notes (Signed)
Virtual Visit via video Note  This visit type was conducted due to national recommendations for restrictions regarding the COVID-19 pandemic (e.g. social distancing).  This format is felt to be most appropriate for this patient at this time.  All issues noted in this document were discussed and addressed.  No physical exam was performed (except for noted visual exam findings with Video Visits).   I connected with Connor Byrd today at  9:00 AM EDT by a video enabled telemedicine application and verified that I am speaking with the correct person using two identifiers. Location patient: home Location provider: work  Persons participating in the virtual visit: patient, provider  I discussed the limitations, risks, security and privacy concerns of performing an evaluation and management service by telephone and the availability of in person appointments. I also discussed with the patient that there may be a patient responsible charge related to this service. The patient expressed understanding and agreed to proceed.  Reason for visit: follow-up  HPI: Anxiety/depression: asymptomatic. No SI. Taking lexapro.  Insomnia: taking trazodone. Gets 7.5 hours. No drowsiness.  Erectile dysfunction: Patient does not really have the opportunity to try the sildenafil.  His wife was in an accident and they have not been sexually active.  He notes his erections are not as robust and he is not able to maintain them for as long.  No issues with ejaculation.  He did see urology.  Elevated LFTs: He does report a history of fatty liver.  He notes no abdominal pain.  No alcohol intake or Tylenol use.    Obesity: His diet is not been good with staying at home during the COVID-19 pandemic.  He has been walking 5 days a week 30 minutes at a time for exercise.  He noted his son wanted to do a watermelon and chicken breast diet for 1 week.   ROS: See pertinent positives and negatives per HPI.  Past Medical  History:  Diagnosis Date  . Depression   . Insomnia     Past Surgical History:  Procedure Laterality Date  . NO PAST SURGERIES    . WISDOM TOOTH EXTRACTION      Family History  Problem Relation Age of Onset  . Alcohol abuse Mother   . Arthritis Mother   . Mental illness Mother   . Hyperlipidemia Father   . Hypertension Father   . Diabetes Father   . Mental illness Maternal Grandmother   . Diabetes Maternal Grandfather   . Alcohol abuse Paternal Grandfather   . Prostate cancer Neg Hx   . Bladder Cancer Neg Hx   . Kidney cancer Neg Hx     SOCIAL HX: Former smoker.   Current Outpatient Medications:  .  escitalopram (LEXAPRO) 20 MG tablet, TAKE 1.5 TABLETS BY MOUTH DAILY, Disp: 135 tablet, Rfl: 1 .  sildenafil (REVATIO) 20 MG tablet, Take 1 to 5 tabs PO daily prn, Disp: 30 tablet, Rfl: 6 .  traZODone (DESYREL) 50 MG tablet, TAKE 1-2 TABLETS BY MOUTH EVERY NIGHT AT BEDTIME AS NEEDED FOR SLEEP, Disp: 90 tablet, Rfl: 1  EXAM:  VITALS per patient if applicable: None.  GENERAL: alert, oriented, appears well and in no acute distress  HEENT: atraumatic, conjunttiva clear, no obvious abnormalities on inspection of external nose and ears  NECK: normal movements of the head and neck  LUNGS: on inspection no signs of respiratory distress, breathing rate appears normal, no obvious gross SOB, gasping or wheezing  CV: no obvious cyanosis  MS: moves all visible extremities without noticeable abnormality  PSYCH/NEURO: pleasant and cooperative, no obvious depression or anxiety, speech and thought processing grossly intact  ASSESSMENT AND PLAN:  Discussed the following assessment and plan:  Anxiety and depression Asymptomatic.  Continue Lexapro.  Insomnia Stable.  Continue trazodone.  Erectile dysfunction He has seen urology.  I advised that he should check with the pharmacy regarding refills of the sildenafil and if needed we can send some in for him to use.  He is having  issues with insurance currently and is having trouble affording visits and testing.  Patient will likely need to follow-up with urology in the future.  Elevated LFTs Likely fatty liver related.  Patient is unable to afford any additional referrals or imaging at this time due to insurance constraints and financial constraints.  I advised that I would like to have an ultrasound completed and refer him to GI for further evaluation of this and he will let us know when he is able to do this financially.  Obesity (BMI 35.0-39.9 without comorbidity) Encouraged continued exercise.  Discussed dietary changes.  Advised against a watermelon and chicken breast diet given the amount of sugar that is in watermelon.  Discussed healthy diet with lean meats and plenty of vegetables with decreasing snack foods and sugary drinks.  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  This was noted after he was off of the virtual visit.  He was referred to endocrinology though never scheduled an appointment.  Did see urology though no evaluation or management was completed.  I will have the [facility] contact him to see if he would like to see endocrinology or wait until his insurance issues are sorted out.  Social distancing precautions and sick precautions given regarding COVID-19.   I discussed the assessment and treatment plan with the patient. The patient was provided an opportunity to ask questions and all were answered. The patient agreed with the plan and demonstrated an understanding of the instructions.   The patient was advised to call back or seek an in-person evaluation if the symptoms worsen or if the condition fails to improve as anticipated.   Tommi Rumps, MD

## 2018-12-03 NOTE — Assessment & Plan Note (Signed)
Stable Continue trazodone 

## 2018-12-03 NOTE — Assessment & Plan Note (Signed)
Asymptomatic.  Continue Lexapro. 

## 2018-12-08 NOTE — Telephone Encounter (Signed)
lmtcb on patients voicemail.  Kori Goins,cma

## 2018-12-14 NOTE — Telephone Encounter (Signed)
lmtcb to discuss referral.  Connor Byrd,cma

## 2019-04-21 ENCOUNTER — Other Ambulatory Visit: Payer: Self-pay | Admitting: Family Medicine

## 2019-04-21 DIAGNOSIS — F5101 Primary insomnia: Secondary | ICD-10-CM

## 2019-06-17 ENCOUNTER — Ambulatory Visit (INDEPENDENT_AMBULATORY_CARE_PROVIDER_SITE_OTHER): Payer: BC Managed Care – PPO | Admitting: Family Medicine

## 2019-06-17 ENCOUNTER — Encounter: Payer: Self-pay | Admitting: Family Medicine

## 2019-06-17 ENCOUNTER — Other Ambulatory Visit: Payer: Self-pay

## 2019-06-17 DIAGNOSIS — F329 Major depressive disorder, single episode, unspecified: Secondary | ICD-10-CM

## 2019-06-17 DIAGNOSIS — F419 Anxiety disorder, unspecified: Secondary | ICD-10-CM | POA: Diagnosis not present

## 2019-06-17 DIAGNOSIS — F5101 Primary insomnia: Secondary | ICD-10-CM

## 2019-06-17 DIAGNOSIS — N402 Nodular prostate without lower urinary tract symptoms: Secondary | ICD-10-CM | POA: Diagnosis not present

## 2019-06-17 DIAGNOSIS — R7989 Other specified abnormal findings of blood chemistry: Secondary | ICD-10-CM

## 2019-06-17 NOTE — Assessment & Plan Note (Signed)
Stable Continue trazodone 

## 2019-06-17 NOTE — Assessment & Plan Note (Signed)
Encouraged adding in exercise.  Discussed dietary changes that he has made.  I encouraged him to make changes that he will be able to stick with chronically.  Discussed that people that typically do the Atkins diet or keto diet end up putting weight back on when they stopped those diets.  Discussed monitoring fatty food intake so that his cholesterol does not elevate.  Discussed snacks that are lower in sodium as well.

## 2019-06-17 NOTE — Assessment & Plan Note (Signed)
Discussed completing lab work-up for this issue though he currently does not have great insurance coverage and does not want to proceed with any lab work at this time due to cost concerns.

## 2019-06-17 NOTE — Assessment & Plan Note (Signed)
- 

## 2019-06-17 NOTE — Progress Notes (Signed)
Virtual Visit via video Note  This visit type was conducted due to national recommendations for restrictions regarding the COVID-19 pandemic (e.g. social distancing).  This format is felt to be most appropriate for this patient at this time.  All issues noted in this document were discussed and addressed.  No physical exam was performed (except for noted visual exam findings with Video Visits).   I connected with Connor Byrd today at  9:30 AM EST by a video enabled telemedicine application and verified that I am speaking with the correct person using two identifiers. Location patient: home Location provider: work Persons participating in the virtual visit: patient, provider  I discussed the limitations, risks, security and privacy concerns of performing an evaluation and management service by telephone and the availability of in person appointments. I also discussed with the patient that there may be a patient responsible charge related to this service. The patient expressed understanding and agreed to proceed.  Reason for visit: follow-up  HPI: Anxiety/depression: He notes no anxiety or depressive symptoms.  Taking Lexapro.  No SI.  His mother did pass away recently.  He is doing fairly well with that.  She had dementia and its been somewhat of a relief.  Insomnia: Sleeps 7 to 8 hours.  The trazodone is beneficial.  Obesity: He started the Atkins diet 6 days ago.  He lost 5 pounds since then.  He is limiting carbs to 20 to 25 g daily.  Breakfast consists of 2 eggs and 2 strips of bacon.  Lunch is a protein on mixed greens.  Dinner last night was spaghetti squash with marinara and beef.  Snacks include cheese sticks, dill pickles, and pork rinds.  He is going to start exercising on a treadmill.  Hypogonadism: Testosterone previously low.  He has been unable to afford any further work-up due to insurance issues.   ROS: See pertinent positives and negatives per HPI.  Past Medical  History:  Diagnosis Date  . Depression   . Insomnia     Past Surgical History:  Procedure Laterality Date  . NO PAST SURGERIES    . WISDOM TOOTH EXTRACTION      Family History  Problem Relation Age of Onset  . Alcohol abuse Mother   . Arthritis Mother   . Mental illness Mother   . Hyperlipidemia Father   . Hypertension Father   . Diabetes Father   . Mental illness Maternal Grandmother   . Diabetes Maternal Grandfather   . Alcohol abuse Paternal Grandfather   . Prostate cancer Neg Hx   . Bladder Cancer Neg Hx   . Kidney cancer Neg Hx     SOCIAL HX: Former smoker   Current Outpatient Medications:  .  escitalopram (LEXAPRO) 20 MG tablet, TAKE 1.5 TABLETS BY MOUTH DAILY, Disp: 135 tablet, Rfl: 1 .  sildenafil (REVATIO) 20 MG tablet, Take 1 to 5 tabs PO daily prn, Disp: 30 tablet, Rfl: 6 .  traZODone (DESYREL) 50 MG tablet, TAKE ONE TO TWO TABLETS BY MOUTH EVERY NIGHT AT BEDTIME AS NEEDED FOR SLEEP, Disp: 90 tablet, Rfl: 0  EXAM:  VITALS per patient if applicable:  GENERAL: alert, oriented, appears well and in no acute distress  HEENT: atraumatic, conjunttiva clear, no obvious abnormalities on inspection of external nose and ears  NECK: normal movements of the head and neck  LUNGS: on inspection no signs of respiratory distress, breathing rate appears normal, no obvious gross SOB, gasping or wheezing  CV: no obvious cyanosis  MS: moves all visible extremities without noticeable abnormality  PSYCH/NEURO: pleasant and cooperative, no obvious depression or anxiety, speech and thought processing grossly intact  ASSESSMENT AND PLAN:  Discussed the following assessment and plan:  Anxiety and depression Well-controlled.  Continue Lexapro.  Prostate nodule He saw urology previously for this.  They did not feel a nodule.  PSA is in the normal range on last check.  Insomnia Stable.  Continue trazodone.  Morbid obesity ([place]) Encouraged adding in exercise.   Discussed dietary changes that he has made.  I encouraged him to make changes that he will be able to stick with chronically.  Discussed that people that typically do the Atkins diet or keto diet end up putting weight back on when they stopped those diets.  Discussed monitoring fatty food intake so that his cholesterol does not elevate.  Discussed snacks that are lower in sodium as well.  Low testosterone in male Discussed completing lab work-up for this issue though he currently does not have great insurance coverage and does not want to proceed with any lab work at this time due to cost concerns.   No orders of the defined types were placed in this encounter.   No orders of the defined types were placed in this encounter.    I discussed the assessment and treatment plan with the patient. The patient was provided an opportunity to ask questions and all were answered. The patient agreed with the plan and demonstrated an understanding of the instructions.   The patient was advised to call back or seek an in-person evaluation if the symptoms worsen or if the condition fails to improve as anticipated.   Tommi Rumps, MD

## 2019-06-17 NOTE — Assessment & Plan Note (Signed)
He saw urology previously for this.  They did not feel a nodule.  PSA is in the normal range on last check.

## 2019-07-07 ENCOUNTER — Other Ambulatory Visit: Payer: Self-pay | Admitting: Family Medicine

## 2019-07-07 DIAGNOSIS — F32A Depression, unspecified: Secondary | ICD-10-CM

## 2019-07-07 DIAGNOSIS — F329 Major depressive disorder, single episode, unspecified: Secondary | ICD-10-CM

## 2019-07-19 ENCOUNTER — Other Ambulatory Visit: Payer: Self-pay | Admitting: Family Medicine

## 2019-07-19 DIAGNOSIS — F5101 Primary insomnia: Secondary | ICD-10-CM

## 2019-08-25 ENCOUNTER — Ambulatory Visit: Payer: BC Managed Care – PPO | Attending: Family

## 2019-08-25 DIAGNOSIS — Z23 Encounter for immunization: Secondary | ICD-10-CM

## 2019-08-25 NOTE — Progress Notes (Signed)
   Covid-19 Vaccination Clinic  Name:  Connor Byrd    MRN: [id number] DOB: 05-30-1971  08/25/2019  Connor Byrd was observed post Covid-19 immunization for 15 minutes without incident. He was provided with Vaccine Information Sheet and instruction to access the V-Safe system.   Connor Byrd was instructed to call 911 with any severe reactions post vaccine: Marland Kitchen Difficulty breathing  . Swelling of face and throat  . A fast heartbeat  . A bad rash all over body  . Dizziness and weakness   Immunizations Administered    Name Date Dose VIS Date Route   Moderna COVID-19 Vaccine 08/25/2019 11:06 AM 0.5 mL 05/10/2019 Intramuscular   Manufacturer: Moderna   Lot: [id number]   [place][id number]

## 2019-09-27 ENCOUNTER — Ambulatory Visit: Payer: BC Managed Care – PPO | Attending: Family

## 2019-09-27 DIAGNOSIS — Z23 Encounter for immunization: Secondary | ICD-10-CM

## 2019-09-27 NOTE — Progress Notes (Signed)
   Covid-19 Vaccination Clinic  Name:  Connor Byrd    MRN: [id number] DOB: January 11, 1971  09/27/2019  Connor Byrd was observed post Covid-19 immunization for 15 minutes without incident. He was provided with Vaccine Information Sheet and instruction to access the V-Safe system.   Connor Byrd was instructed to call 911 with any severe reactions post vaccine: Marland Kitchen Difficulty breathing  . Swelling of face and throat  . A fast heartbeat  . A bad rash all over body  . Dizziness and weakness   Immunizations Administered    Name Date Dose VIS Date Route   Moderna COVID-19 Vaccine 09/27/2019 10:55 AM 0.5 mL 05/2019 Intramuscular   Manufacturer: Moderna   Lot: [id number]   [place]:[id number]

## 2019-10-10 ENCOUNTER — Other Ambulatory Visit: Payer: Self-pay | Admitting: Family Medicine

## 2019-10-10 DIAGNOSIS — F329 Major depressive disorder, single episode, unspecified: Secondary | ICD-10-CM

## 2019-10-10 DIAGNOSIS — F32A Depression, unspecified: Secondary | ICD-10-CM

## 2019-11-01 ENCOUNTER — Other Ambulatory Visit: Payer: Self-pay | Admitting: Family Medicine

## 2019-11-01 DIAGNOSIS — F5101 Primary insomnia: Secondary | ICD-10-CM

## 2019-12-30 ENCOUNTER — Ambulatory Visit: Payer: BC Managed Care – PPO | Admitting: Family Medicine

## 2020-01-02 ENCOUNTER — Other Ambulatory Visit: Payer: Self-pay

## 2020-01-02 ENCOUNTER — Telehealth (INDEPENDENT_AMBULATORY_CARE_PROVIDER_SITE_OTHER): Payer: BC Managed Care – PPO | Admitting: Family Medicine

## 2020-01-02 ENCOUNTER — Encounter: Payer: Self-pay | Admitting: Family Medicine

## 2020-01-02 DIAGNOSIS — R7989 Other specified abnormal findings of blood chemistry: Secondary | ICD-10-CM | POA: Diagnosis not present

## 2020-01-02 DIAGNOSIS — F5101 Primary insomnia: Secondary | ICD-10-CM

## 2020-01-02 DIAGNOSIS — F329 Major depressive disorder, single episode, unspecified: Secondary | ICD-10-CM

## 2020-01-02 DIAGNOSIS — F419 Anxiety disorder, unspecified: Secondary | ICD-10-CM

## 2020-01-02 DIAGNOSIS — F32A Depression, unspecified: Secondary | ICD-10-CM

## 2020-01-02 DIAGNOSIS — E669 Obesity, unspecified: Secondary | ICD-10-CM | POA: Diagnosis not present

## 2020-01-02 DIAGNOSIS — N529 Male erectile dysfunction, unspecified: Secondary | ICD-10-CM

## 2020-01-02 MED ORDER — ESCITALOPRAM OXALATE 20 MG PO TABS: 30.0000 mg | ORAL_TABLET | Freq: Every day | ORAL | 1 refills | Status: DC

## 2020-01-02 MED ORDER — TRAZODONE HCL 50 MG PO TABS: ORAL_TABLET | ORAL | 1 refills | Status: DC

## 2020-01-02 NOTE — Assessment & Plan Note (Signed)
Weight has trended down steadily.  Encouraged continued diet and exercise.

## 2020-01-02 NOTE — Assessment & Plan Note (Signed)
Asymptomatic.  Continue current regimen. 

## 2020-01-02 NOTE — Progress Notes (Signed)
Virtual Visit via video Note  This visit type was conducted due to national recommendations for restrictions regarding the COVID-19 pandemic (e.g. social distancing).  This format is felt to be most appropriate for this patient at this time.  All issues noted in this document were discussed and addressed.  No physical exam was performed (except for noted visual exam findings with Video Visits).   I connected with Connor Byrd today at  4:00 PM EDT by a video enabled telemedicine application and verified that I am speaking with the correct person using two identifiers. Location patient: home Location provider: work Persons participating in the virtual visit: patient, provider  I discussed the limitations, risks, security and privacy concerns of performing an evaluation and management service by telephone and the availability of in person appointments. I also discussed with the patient that there may be a patient responsible charge related to this service. The patient expressed understanding and agreed to proceed.  Reason for visit: f/u.  HPI: Erectile dysfunction: Patient notes the sildenafil is very helpful.  No chest pain or shortness of breath.  Anxiety/depression: He denies any symptoms.  Taking Lexapro and trazodone.  Trazodone does help with his sleep.  He does not want to change these medicines currently as he has been stable.  Obesity: He has been exercising and doing carb avoidance.  His weight is trending down about 25 pounds this year.  Hypergonadism: Patient has deferred further work-up for this at this time due to financial difficulties.   ROS: See pertinent positives and negatives per HPI.  Past Medical History:  Diagnosis Date  . Depression   . Insomnia     Past Surgical History:  Procedure Laterality Date  . NO PAST SURGERIES    . WISDOM TOOTH EXTRACTION      Family History  Problem Relation Age of Onset  . Alcohol abuse Mother   . Arthritis Mother   .  Mental illness Mother   . Hyperlipidemia Father   . Hypertension Father   . Diabetes Father   . Mental illness Maternal Grandmother   . Diabetes Maternal Grandfather   . Alcohol abuse Paternal Grandfather   . Prostate cancer Neg Hx   . Bladder Cancer Neg Hx   . Kidney cancer Neg Hx     SOCIAL HX: Former smoker   Current Outpatient Medications:  .  escitalopram (LEXAPRO) 20 MG tablet, Take 1.5 tablets (30 mg total) by mouth daily., Disp: 135 tablet, Rfl: 1 .  sildenafil (REVATIO) 20 MG tablet, Take 1 to 5 tabs PO daily prn, Disp: 30 tablet, Rfl: 6 .  traZODone (DESYREL) 50 MG tablet, TAKE ONE TO TWO TABLETS BY MOUTH EVERY NIGHT AT BEDTIME AS NEEDED FOR SLEEP, Disp: 90 tablet, Rfl: 1  EXAM:  VITALS per patient if applicable:  GENERAL: alert, oriented, appears well and in no acute distress  HEENT: atraumatic, conjunttiva clear, no obvious abnormalities on inspection of external nose and ears  NECK: normal movements of the head and neck  LUNGS: on inspection no signs of respiratory distress, breathing rate appears normal, no obvious gross SOB, gasping or wheezing  CV: no obvious cyanosis  MS: moves all visible extremities without noticeable abnormality  PSYCH/NEURO: pleasant and cooperative, no obvious depression or anxiety, speech and thought processing grossly intact  ASSESSMENT AND PLAN:  Discussed the following assessment and plan:  Obesity (BMI 35.0-39.9 without comorbidity) Weight has trended down steadily.  Encouraged continued diet and exercise.  Low testosterone in male Discussed  completing further work-up for this issue and the risk of missing a significant pituitary lesion without work-up.  He defers this at this time given financial issues.  He will let us know when he is ready to proceed with this.  Erectile dysfunction Stable.  Continue sildenafil as needed.  Anxiety and depression Asymptomatic.  Continue current regimen.   No orders of the defined  types were placed in this encounter.   Meds ordered this encounter  Medications  . escitalopram (LEXAPRO) 20 MG tablet    Sig: Take 1.5 tablets (30 mg total) by mouth daily.    Dispense:  135 tablet    Refill:  1  . traZODone (DESYREL) 50 MG tablet    Sig: TAKE ONE TO TWO TABLETS BY MOUTH EVERY NIGHT AT BEDTIME AS NEEDED FOR SLEEP    Dispense:  90 tablet    Refill:  1     I discussed the assessment and treatment plan with the patient. The patient was provided an opportunity to ask questions and all were answered. The patient agreed with the plan and demonstrated an understanding of the instructions.   The patient was advised to call back or seek an in-person evaluation if the symptoms worsen or if the condition fails to improve as anticipated.  Tommi Rumps, MD

## 2020-01-02 NOTE — Assessment & Plan Note (Signed)
Stable. Continue sildenafil as needed. 

## 2020-01-02 NOTE — Assessment & Plan Note (Signed)
Discussed completing further work-up for this issue and the risk of missing a significant pituitary lesion without work-up.  He defers this at this time given financial issues.  He will let us know when he is ready to proceed with this.

## 2020-05-15 ENCOUNTER — Other Ambulatory Visit: Payer: Self-pay

## 2020-05-15 ENCOUNTER — Emergency Department: Payer: BC Managed Care – PPO

## 2020-05-15 DIAGNOSIS — Z79899 Other long term (current) drug therapy: Secondary | ICD-10-CM | POA: Insufficient documentation

## 2020-05-15 DIAGNOSIS — Z87891 Personal history of nicotine dependence: Secondary | ICD-10-CM | POA: Insufficient documentation

## 2020-05-15 DIAGNOSIS — R6883 Chills (without fever): Secondary | ICD-10-CM | POA: Diagnosis not present

## 2020-05-15 DIAGNOSIS — E876 Hypokalemia: Secondary | ICD-10-CM | POA: Insufficient documentation

## 2020-05-15 DIAGNOSIS — R0602 Shortness of breath: Secondary | ICD-10-CM | POA: Insufficient documentation

## 2020-05-15 DIAGNOSIS — R5383 Other fatigue: Secondary | ICD-10-CM | POA: Insufficient documentation

## 2020-05-15 NOTE — ED Triage Notes (Signed)
Pt presents to ED feeling short of breath and anxious. Pt states he went to lie down and had a sudden onset of sob. Pt reports he got his COVID booster yesterday and was not sure if it could be related. Continued SOB currently with slight improvement. Pt diaphoretic in triage.

## 2020-05-16 ENCOUNTER — Emergency Department
Admission: EM | Admit: 2020-05-16 | Discharge: 2020-05-16 | Disposition: A | Discharge status: Home / Self Care | Payer: BC Managed Care – PPO | Attending: Emergency Medicine | Admitting: Emergency Medicine

## 2020-05-16 DIAGNOSIS — R0602 Shortness of breath: Secondary | ICD-10-CM

## 2020-05-16 DIAGNOSIS — E876 Hypokalemia: Secondary | ICD-10-CM

## 2020-05-16 LAB — TROPONIN I (HIGH SENSITIVITY)
Troponin I (High Sensitivity): 6 ng/L (ref <18)
Troponin I (High Sensitivity): 7 ng/L (ref <18)

## 2020-05-16 LAB — CBC WITH DIFFERENTIAL/PLATELET
Abs Immature Granulocytes: 0.02 10*3/uL (ref 0.00–0.07)
Basophils Absolute: 0 10*3/uL (ref 0.0–0.1)
Basophils Relative: 0 %
Eosinophils Absolute: 0.1 10*3/uL (ref 0.0–0.5)
Eosinophils Relative: 1 %
HCT: 46.6 % (ref 39.0–52.0)
Hemoglobin: 16.1 g/dL (ref 13.0–17.0)
Immature Granulocytes: 0 %
Lymphocytes Relative: 25 %
Lymphs Abs: 1.7 10*3/uL (ref 0.7–4.0)
MCH: 31.1 pg (ref 26.0–34.0)
MCHC: 34.5 g/dL (ref 30.0–36.0)
MCV: 90 fL (ref 80.0–100.0)
Monocytes Absolute: 0.8 10*3/uL (ref 0.1–1.0)
Monocytes Relative: 11 %
Neutro Abs: 4.4 10*3/uL (ref 1.7–7.7)
Neutrophils Relative %: 63 %
Platelets: 198 10*3/uL (ref 150–400)
RBC: 5.18 MIL/uL (ref 4.22–5.81)
RDW: 12.4 % (ref 11.5–15.5)
WBC: 7 10*3/uL (ref 4.0–10.5)
nRBC: 0 % (ref 0.0–0.2)

## 2020-05-16 LAB — COMPREHENSIVE METABOLIC PANEL
ALT: 115 U/L — ABNORMAL HIGH (ref 0–44)
AST: 56 U/L — ABNORMAL HIGH (ref 15–41)
Albumin: 4.3 g/dL (ref 3.5–5.0)
Alkaline Phosphatase: 69 U/L (ref 38–126)
Anion gap: 11 (ref 5–15)
BUN: 11 mg/dL (ref 6–20)
CO2: 23 mmol/L (ref 22–32)
Calcium: 9.2 mg/dL (ref 8.9–10.3)
Chloride: 100 mmol/L (ref 98–111)
Creatinine, Ser: 1.05 mg/dL (ref 0.61–1.24)
GFR, Estimated: >60 mL/min (ref >60)
Glucose, Bld: 130 mg/dL — ABNORMAL HIGH (ref 70–99)
Potassium: 2.9 mmol/L — ABNORMAL LOW (ref 3.5–5.1)
Sodium: 134 mmol/L — ABNORMAL LOW (ref 135–145)
Total Bilirubin: 1.1 mg/dL (ref 0.3–1.2)
Total Protein: 7.5 g/dL (ref 6.5–8.1)

## 2020-05-16 MED ORDER — POTASSIUM CHLORIDE CRYS ER 20 MEQ PO TBCR
40.0000 meq | EXTENDED_RELEASE_TABLET | Freq: Once | ORAL | Status: AC
  Administered 2020-05-16: 40 meq via ORAL
  Filled 2020-05-16: qty 2

## 2020-05-16 NOTE — ED Notes (Signed)
Pt denies chest pain or trouble breathing at this tmie

## 2020-05-16 NOTE — ED Provider Notes (Signed)
[facility] Emergency Department Provider Note  ____________________________________________  Time seen: Approximately 5:37 AM  I have reviewed the triage vital signs and the nursing notes.   HISTORY  Chief Complaint Shortness of Breath   HPI Connor Byrd is a 50 y.o. male with history of anxiety, depression, elevated BMI who presents for evaluation of shortness of breath.  Patient reports that he received his Covid shot 2 days ago.   He felt bad all day today with chills and fatigue.  This evening he laid down to go to sleep and he felt short of breath.  He reports it was a very brief episode of not being able to breathe while laying down.  Immediately when he sat up that went away.  He has not had any further episodes of difficulty breathing since then.  He check his blood pressure which was elevated at home and prompted his visit to the emergency room.  Patient has been 6 hours in the waiting room with no further episodes of difficulty breathing or shortness of breath.  He denies any history of COPD or asthma, he is a former smoker, no history of coronary artery disease, PE or DVT, no recent travel immobilization, no leg pain or swelling, no hemoptysis or exogenous hormones.  No fever or chills, no cough, no vomiting or diarrhea, no abdominal pain, no back pain.  Past Medical History:  Diagnosis Date  . Depression   . Insomnia     Patient Active Problem List   Diagnosis Date Noted  . Elevated LFTs 12/03/2018  . Low testosterone in male 12/03/2018  . Obesity (BMI 35.0-39.9 without comorbidity) 11/25/2017  . Erectile dysfunction 05/27/2017  . Encounter for general adult medical examination with abnormal findings 05/08/2016  . Insomnia   . Anxiety and depression     Past Surgical History:  Procedure Laterality Date  . NO PAST SURGERIES    . WISDOM TOOTH EXTRACTION      Prior to Admission medications   Medication Sig Start Date End Date Taking?  Authorizing Provider  escitalopram (LEXAPRO) 20 MG tablet Take 1.5 tablets (30 mg total) by mouth daily. 01/02/20   Leone Haven, MD  sildenafil (REVATIO) 20 MG tablet Take 1 to 5 tabs PO daily prn 06/19/17   Nickie Retort, MD  traZODone (DESYREL) 50 MG tablet TAKE ONE TO TWO TABLETS BY MOUTH EVERY NIGHT AT BEDTIME AS NEEDED FOR SLEEP 01/02/20   Leone Haven, MD    Allergies Patient has no known allergies.  Family History  Problem Relation Age of Onset  . Alcohol abuse Mother   . Arthritis Mother   . Mental illness Mother   . Hyperlipidemia Father   . Hypertension Father   . Diabetes Father   . Mental illness Maternal Grandmother   . Diabetes Maternal Grandfather   . Alcohol abuse Paternal Grandfather   . Prostate cancer Neg Hx   . Bladder Cancer Neg Hx   . Kidney cancer Neg Hx     Social History Social History   Tobacco Use  . Smoking status: Former Smoker    Quit date: 01/03/2016    Years since quitting: 4.3  . Smokeless tobacco: Never Used  Vaping Use  . Vaping Use: Never used  Substance Use Topics  . Alcohol use: Yes    Alcohol/week: 2.0 - 4.0 standard drinks    Types: 2 - 4 Standard drinks or equivalent per week  . Drug use: Yes  Types: Marijuana    Comment: only once    Review of Systems  Constitutional: Negative for fever. Eyes: Negative for visual changes. ENT: Negative for sore throat. Neck: No neck pain  Cardiovascular: Negative for chest pain. Respiratory: + shortness of breath. Gastrointestinal: Negative for abdominal pain, vomiting or diarrhea. Genitourinary: Negative for dysuria. Musculoskeletal: Negative for back pain. Skin: Negative for rash. Neurological: Negative for headaches, weakness or numbness. Psych: No SI or HI  ____________________________________________   PHYSICAL EXAM:  VITAL SIGNS: ED Triage Vitals [05/15/20 2343]  Enc Vitals Group     BP (!) 163/90     Pulse Rate 99     Resp 20     Temp 99.1 F (37.3  C)     Temp Source Oral     SpO2 97 %     Weight 290 lb (131.5 kg)     Height 6' (1.829 m)     Head Circumference      Peak Flow      Pain Score 0     Pain Loc      Pain Edu?      Excl. in [place]?     Constitutional: Alert and oriented. Well appearing and in no apparent distress. HEENT:      Head: Normocephalic and atraumatic.         Eyes: Conjunctivae are normal. Sclera is non-icteric.       Mouth/Throat: Mucous membranes are moist.       Neck: Supple with no signs of meningismus. Cardiovascular: Regular rate and rhythm. No murmurs, gallops, or rubs. 2+ symmetrical distal pulses are present in all extremities. No JVD. Respiratory: Normal respiratory effort. Lungs are clear to auscultation bilaterally. No wheezes, crackles, or rhonchi.  Gastrointestinal: Soft, non tender, and non distended. Musculoskeletal:  No edema, cyanosis, or erythema of extremities. Neurologic: Normal speech and language. Face is symmetric. Moving all extremities. No gross focal neurologic deficits are appreciated. Skin: Skin is warm, dry and intact. No rash noted. Psychiatric: Mood and affect are normal. Speech and behavior are normal.  ____________________________________________   LABS (all labs ordered are listed, but only abnormal results are displayed)  Labs Reviewed  COMPREHENSIVE METABOLIC PANEL - Abnormal; Notable for the following components:      Result Value   Sodium 134 (*)    Potassium 2.9 (*)    Glucose, Bld 130 (*)    AST 56 (*)    ALT 115 (*)    All other components within normal limits  CBC WITH DIFFERENTIAL/PLATELET  TROPONIN I (HIGH SENSITIVITY)  TROPONIN I (HIGH SENSITIVITY)   ____________________________________________  EKG  ED ECG REPORT I, Rudene Re, the attending physician, personally viewed and interpreted this ECG.  Normal sinus rhythm, rate of 96, normal intervals, normal axis, no ST elevations or depressions.  Normal  EKG. ____________________________________________  RADIOLOGY  I have personally reviewed the images performed during this visit and I agree with the Radiologist's read.   Interpretation by Radiologist:  DG Chest 2 View  Result Date: 05/16/2020 CLINICAL DATA:  Shortness of breath and anxiety. EXAM: CHEST - 2 VIEW COMPARISON:  None. FINDINGS: Very mild linear atelectasis is seen within the bilateral lung bases. There is no evidence of a pleural effusion or pneumothorax. The heart size and mediastinal contours are within normal limits. There is mild tortuosity of the descending thoracic aorta. The visualized skeletal structures are unremarkable. IMPRESSION: No active cardiopulmonary disease. Electronically Signed   By: Virgina Norfolk M.D.   On:  05/16/2020 00:09     ____________________________________________   PROCEDURES  Procedure(s) performed:yes .1-3 Lead EKG Interpretation Performed by: Rudene Re, MD Authorized by: Rudene Re, MD     Interpretation: normal     ECG rate assessment: normal     Rhythm: sinus rhythm     Ectopy: none     Critical Care performed:  None ____________________________________________   INITIAL IMPRESSION / ASSESSMENT AND PLAN / ED COURSE  49 y.o. male with history of anxiety, depression, elevated BMI who presents for evaluation of a brief episode of shortness of breath that happened as he was laying down to sleep associated with elevated BP. The episode resolved when he sat up but he was concerned about his BP. He has been asymptomatic since arrival to the ED 6 hrs ago.  Is extremely well-appearing in no distress with normal vital signs, slightly elevated BP, afebrile, normal work of breathing, normal sats, lungs are clear to auscultation, heart regular rate and rhythm with no murmurs, no leg swelling or edema.  EKG with no signs of dysrhythmias or ischemia.  Chest x-ray visualized by me with no signs of edema, pneumothorax, pneumonia.   X-ray was confirmed by radiology.  2 high-sensitivity troponin negative.  Labs with no leukocytosis, no anemia.  Mildly elevated LFTs which seems to be patient's baseline.  Mildly elevated glucose of 130 with no history of diabetes.  This is a nonfasting blood glucose.  Discussed this finding with patient recommended follow-up with his doctor for a fasting blood glucose.  Mild hypokalemia with a K of 2.9 which is supplemented p.o.  Patient provided with a list of foods that are rich in potassium and encouraged to eat those to prevent any further episodes of hypokalemia.  Review of epic shows that patient has had blood work done 2 years ago with normal potassium.  Does not take any potassium wasting medications.  Patient monitored on telemetry with no signs of dysrhythmias.  Since he remains asymptomatic for 7 hours he was clear for discharge home.  Recommended rechecking blood pressure in a couple of days and if persistently elevated to follow-up with PCP.  Recommended return to the emergency room for chest pain, shortness of breath, syncope, or fever.     _____________________________________________ Please note:  Patient was evaluated in Emergency Department today for the symptoms described in the history of present illness. Patient was evaluated in the context of the global COVID-19 pandemic, which necessitated consideration that the patient might be at risk for infection with the SARS-CoV-2 virus that causes COVID-19. Institutional protocols and algorithms that pertain to the evaluation of patients at risk for COVID-19 are in a state of rapid change based on information released by regulatory bodies including the CDC and federal and state organizations. These policies and algorithms were followed during the patient's care in the ED.  Some ED evaluations and interventions may be delayed as a result of limited staffing during the pandemic.   [place] Controlled Substance Database was reviewed by  me. ____________________________________________   FINAL CLINICAL IMPRESSION(S) / ED DIAGNOSES   Final diagnoses:  Shortness of breath  Hypokalemia      NEW MEDICATIONS STARTED DURING THIS VISIT:  ED Discharge Orders    None       Note:  This document was prepared using Dragon voice recognition software and may include unintentional dictation errors.    Alfred Levins, [place], MD 05/16/20 [phone]

## 2020-05-16 NOTE — Discharge Instructions (Signed)
Your blood work showed a blood glucose of 130 which is slightly elevated.  It is important they follow-up with your primary care doctor to have a fasting blood glucose done and hemoglobin A1c testing to rule out early diabetes.  Your potassium is also slightly low.  Make sure to read the attachment on hypokalemia with a list of foods that are rich in potassium.  Try to incorporate some of those in your diet.  Return to the emergency room if you have any further episodes of chest pain or shortness of breath, otherwise follow-up with your primary care doctor.

## 2020-07-18 ENCOUNTER — Other Ambulatory Visit: Payer: Self-pay | Admitting: Family Medicine

## 2020-07-18 DIAGNOSIS — F32A Depression, unspecified: Secondary | ICD-10-CM

## 2020-08-31 ENCOUNTER — Ambulatory Visit: Payer: BC Managed Care – PPO | Admitting: Family Medicine

## 2020-08-31 ENCOUNTER — Other Ambulatory Visit: Payer: Self-pay

## 2020-08-31 ENCOUNTER — Encounter: Payer: Self-pay | Admitting: Family Medicine

## 2020-08-31 DIAGNOSIS — F5101 Primary insomnia: Secondary | ICD-10-CM | POA: Diagnosis not present

## 2020-08-31 DIAGNOSIS — R7989 Other specified abnormal findings of blood chemistry: Secondary | ICD-10-CM | POA: Diagnosis not present

## 2020-08-31 DIAGNOSIS — F419 Anxiety disorder, unspecified: Secondary | ICD-10-CM

## 2020-08-31 DIAGNOSIS — F32A Depression, unspecified: Secondary | ICD-10-CM | POA: Diagnosis not present

## 2020-08-31 DIAGNOSIS — N529 Male erectile dysfunction, unspecified: Secondary | ICD-10-CM

## 2020-08-31 MED ORDER — ESCITALOPRAM OXALATE 20 MG PO TABS: 30.0000 mg | ORAL_TABLET | Freq: Every day | ORAL | 1 refills | Status: DC

## 2020-08-31 MED ORDER — TRAZODONE HCL 50 MG PO TABS: ORAL_TABLET | ORAL | 1 refills | Status: DC

## 2020-08-31 MED ORDER — SILDENAFIL CITRATE 20 MG PO TABS
ORAL_TABLET | ORAL | 0 refills | Status: DC
Start: 2020-08-31 — End: 2021-08-26

## 2020-08-31 NOTE — Assessment & Plan Note (Signed)
Possible fatty liver related.  LFTs were slightly higher recently.  Patient was a previously heavy alcohol drinker.  Discussed that that could be a contributing factor.  Advised rechecking today though he declines given cost concerns as he is still paying off the ED visit.  I encouraged him to reduce and hopefully eliminate his alcohol intake.  We will plan on following up in 6 months for recheck of labs at his request.

## 2020-08-31 NOTE — Patient Instructions (Signed)
Nice to see you. I have refilled your medications. I will see you back in 6 months.

## 2020-08-31 NOTE — Progress Notes (Signed)
Connor Rumps, MD Phone: [phone]  TYREK LAWHORN is a 50 y.o. male who presents today for follow-up.  Anxiety/depression: Patient denies symptoms.  The Lexapro has been beneficial.  Has been on medication for a number of years.  He is not interested in coming off of this.  No SI.  Insomnia: Patient notes the trazodone helps with his sleep.  He takes 25 mg most nights.  Gets 7 hours of sleep.  No drowsiness.  Erectile dysfunction: Patient occasionally takes sildenafil.  No issues getting an erection though does have issues maintaining an erection if he does not take the sildenafil.  This works well for him.  Elevated LFTs: Noted to be slightly higher during recent ED visit.  Notes a long time ago he is to drink 6 beers a night.  Now he has 1 mixed drink per night.  No abdominal pain.  Patient was seen in the ED for an issue of shortness of breath when he was laying down following having his COVID vaccine.  It would resolve when he sat up.  He has not had any issues with shortness of breath or chest pain since his ED visit.  His evaluation was negative for specific cause.  Social History   Tobacco Use  Smoking Status Former Smoker  . Quit date: 01/03/2016  . Years since quitting: 4.6  Smokeless Tobacco Never Used    Current Outpatient Medications on File Prior to Visit  Medication Sig Dispense Refill  . dexlansoprazole (DEXILANT) 60 MG capsule 1     No current facility-administered medications on file prior to visit.     ROS see history of present illness  Objective  Physical Exam Vitals:   08/31/20 1605  BP: 130/80  Pulse: 85  Temp: 98.3 F (36.8 C)  SpO2: 97%    BP Readings from Last 3 Encounters:  08/31/20 130/80  05/16/20 (!) 166/116  05/28/18 120/80   Wt Readings from Last 3 Encounters:  08/31/20 296 lb 12.8 oz (134.6 kg)  05/15/20 290 lb (131.5 kg)  01/02/20 (!) 276 lb (125.2 kg)    Physical Exam Constitutional:      General: He is not in acute  distress.    Appearance: He is not diaphoretic.  Cardiovascular:     Rate and Rhythm: Normal rate and regular rhythm.     Heart sounds: Normal heart sounds.  Pulmonary:     Effort: Pulmonary effort is normal.     Breath sounds: Normal breath sounds.  Abdominal:     General: Bowel sounds are normal. There is no distension.     Palpations: Abdomen is soft.     Tenderness: There is no abdominal tenderness. There is no guarding or rebound.  Skin:    General: Skin is warm and dry.  Neurological:     Mental Status: He is alert.      Assessment/Plan: Please see individual problem list.  Problem List Items Addressed This Visit    Anxiety and depression    Stable.  He will continue Lexapro 30 mg once daily.      Relevant Medications   escitalopram (LEXAPRO) 20 MG tablet   traZODone (DESYREL) 50 MG tablet   Elevated LFTs    Possible fatty liver related.  LFTs were slightly higher recently.  Patient was a previously heavy alcohol drinker.  Discussed that that could be a contributing factor.  Advised rechecking today though he declines given cost concerns as he is still paying off the ED visit.  I encouraged him to reduce and hopefully eliminate his alcohol intake.  We will plan on following up in 6 months for recheck of labs at his request.      Erectile dysfunction    Stable.  Refill of sildenafil given.      Insomnia    Stable.  Continue trazodone 25 mg once nightly as needed.      Relevant Medications   traZODone (DESYREL) 50 MG tablet    Other Visit Diagnoses    Depression, unspecified depression type       Relevant Medications   escitalopram (LEXAPRO) 20 MG tablet   traZODone (DESYREL) 50 MG tablet       This visit occurred during the SARS-CoV-2 public health emergency.  Safety protocols were in place, including screening questions prior to the visit, additional usage of staff PPE, and extensive cleaning of exam room while observing appropriate contact time as  indicated for disinfecting solutions.    Connor Rumps, MD [facility]

## 2020-08-31 NOTE — Assessment & Plan Note (Signed)
Stable.  He will continue Lexapro 30 mg once daily.

## 2020-08-31 NOTE — Assessment & Plan Note (Signed)
Stable.  Continue trazodone 25 mg once nightly as needed.

## 2020-08-31 NOTE — Assessment & Plan Note (Signed)
Stable.  Refill of sildenafil given.

## 2021-03-05 ENCOUNTER — Ambulatory Visit: Payer: BC Managed Care – PPO | Admitting: Family Medicine

## 2021-03-05 ENCOUNTER — Other Ambulatory Visit: Payer: Self-pay

## 2021-03-05 DIAGNOSIS — R7989 Other specified abnormal findings of blood chemistry: Secondary | ICD-10-CM | POA: Diagnosis not present

## 2021-03-05 DIAGNOSIS — I10 Essential (primary) hypertension: Secondary | ICD-10-CM | POA: Diagnosis not present

## 2021-03-05 DIAGNOSIS — F32A Depression, unspecified: Secondary | ICD-10-CM

## 2021-03-05 DIAGNOSIS — F419 Anxiety disorder, unspecified: Secondary | ICD-10-CM

## 2021-03-05 DIAGNOSIS — N529 Male erectile dysfunction, unspecified: Secondary | ICD-10-CM | POA: Diagnosis not present

## 2021-03-05 DIAGNOSIS — R030 Elevated blood-pressure reading, without diagnosis of hypertension: Secondary | ICD-10-CM | POA: Insufficient documentation

## 2021-03-05 DIAGNOSIS — E669 Obesity, unspecified: Secondary | ICD-10-CM | POA: Diagnosis not present

## 2021-03-05 DIAGNOSIS — L821 Other seborrheic keratosis: Secondary | ICD-10-CM | POA: Diagnosis not present

## 2021-03-05 LAB — LIPID PANEL
Cholesterol: 199 mg/dL (ref 0–200)
HDL: 41.2 mg/dL (ref >39.00)
LDL Cholesterol: 134 mg/dL — ABNORMAL HIGH (ref 0–99)
NonHDL: 157.93
Total CHOL/HDL Ratio: 5
Triglycerides: 120 mg/dL (ref 0.0–149.0)
VLDL: 24 mg/dL (ref 0.0–40.0)

## 2021-03-05 LAB — COMPREHENSIVE METABOLIC PANEL
ALT: 103 U/L — ABNORMAL HIGH (ref 0–53)
AST: 59 U/L — ABNORMAL HIGH (ref 0–37)
Albumin: 4.5 g/dL (ref 3.5–5.2)
Alkaline Phosphatase: 87 U/L (ref 39–117)
BUN: 8 mg/dL (ref 6–23)
CO2: 26 mEq/L (ref 19–32)
Calcium: 9.4 mg/dL (ref 8.4–10.5)
Chloride: 104 mEq/L (ref 96–112)
Creatinine, Ser: 0.71 mg/dL (ref 0.40–1.50)
GFR: 108.13 mL/min (ref >60.00)
Glucose, Bld: 101 mg/dL — ABNORMAL HIGH (ref 70–99)
Potassium: 4 mEq/L (ref 3.5–5.1)
Sodium: 140 mEq/L (ref 135–145)
Total Bilirubin: 0.6 mg/dL (ref 0.2–1.2)
Total Protein: 6.9 g/dL (ref 6.0–8.3)

## 2021-03-05 LAB — HEMOGLOBIN A1C: Hgb A1c MFr Bld: 5.8 % (ref 4.6–6.5)

## 2021-03-05 LAB — CBC
HCT: 50.8 % (ref 39.0–52.0)
Hemoglobin: 17.3 g/dL — ABNORMAL HIGH (ref 13.0–17.0)
MCHC: 34 g/dL (ref 30.0–36.0)
MCV: 93.8 fl (ref 78.0–100.0)
Platelets: 219 10*3/uL (ref 150.0–400.0)
RBC: 5.42 Mil/uL (ref 4.22–5.81)
RDW: 13.1 % (ref 11.5–15.5)
WBC: 5.6 10*3/uL (ref 4.0–10.5)

## 2021-03-05 LAB — TSH: TSH: 0.71 u[IU]/mL (ref 0.35–5.50)

## 2021-03-05 NOTE — Assessment & Plan Note (Signed)
I have asked that he start checking his blood pressure at home for the next 2 weeks.  He will follow-up for nurse BP check at that time.  Discussed the potential for medication for blood pressure depending on what his values are at home and his repeat value in the offices.

## 2021-03-05 NOTE — Progress Notes (Signed)
Tommi Rumps, MD Phone: [phone]  Connor Byrd is a 50 y.o. male who presents today for f/u.  Depression: Patient notes no depression.  He continues on Lexapro and trazodone.  The trazodone helps with sleep.  He has no drowsiness.  No SI.  No anxiety.  Erectile dysfunction: This sildenafil is working adequately.  He is able to get to ejaculation.  Skin lesion: This is on his left posterior shoulder.  It has been present 3 to 4 months.  No changes.  No pain.  No bleeding.  Elevated LFTs: No right upper quadrant pain.  He does have 1 alcoholic beverage daily.  Elevated blood pressure: He does not check this at home.  No chest pain.  No shortness of breath.  Obesity: The patient has been walking daily for 30 to 45 minutes for quite some time.  He notes about 6 months ago he started to reduce his portion sizes.  He will oftentimes have leftovers or a sandwich and [place] fries for lunch.  He will have a meat and 2 sides for dinner.  He always has vegetables with dinner.  The meats are typically grilled.  He has cut out most of his snacks.  He does get some bananas as fruits.  Social History   Tobacco Use  Smoking Status Former   Types: Cigarettes   Quit date: 01/03/2016   Years since quitting: 5.1  Smokeless Tobacco Never    Current Outpatient Medications on File Prior to Visit  Medication Sig Dispense Refill   dexlansoprazole (DEXILANT) 60 MG capsule 1     escitalopram (LEXAPRO) 20 MG tablet Take 1.5 tablets (30 mg total) by mouth daily. 135 tablet 1   sildenafil (REVATIO) 20 MG tablet Take 1 to 5 tabs PO daily prn 30 tablet 0   traZODone (DESYREL) 50 MG tablet TAKE ONE TO TWO TABLETS BY MOUTH EVERY NIGHT AT BEDTIME AS NEEDED FOR SLEEP 90 tablet 1   No current facility-administered medications on file prior to visit.     ROS see history of present illness  Objective  Physical Exam Vitals:   03/05/21 0905 03/05/21 0916  BP: (!) 160/110 (!) 150/90  Pulse: 77    Temp: 97.9 F (36.6 C)   SpO2: 97%     BP Readings from Last 3 Encounters:  03/05/21 (!) 150/90  08/31/20 130/80  05/16/20 (!) 166/116   Wt Readings from Last 3 Encounters:  03/05/21 292 lb 9.6 oz (132.7 kg)  08/31/20 296 lb 12.8 oz (134.6 kg)  05/15/20 290 lb (131.5 kg)    Physical Exam Constitutional:      General: He is not in acute distress.    Appearance: He is not diaphoretic.  Cardiovascular:     Rate and Rhythm: Normal rate and regular rhythm.     Heart sounds: Normal heart sounds.  Pulmonary:     Effort: Pulmonary effort is normal.     Breath sounds: Normal breath sounds.  Abdominal:     General: Bowel sounds are normal. There is no distension.     Palpations: Abdomen is soft.     Tenderness: There is no abdominal tenderness. There is no guarding or rebound.  Skin:    General: Skin is warm and dry.       Neurological:     Mental Status: He is alert.     Assessment/Plan: Please see individual problem list.  Problem List Items Addressed This Visit     Anxiety and depression  Asymptomatic.  He will continue Lexapro 20 mg once daily and trazodone 50-100 mg nightly as needed for sleep.  I did discuss cutting out the alcohol and caffeine before bed may be helpful for his sleep.      Elevated blood pressure reading in office with diagnosis of hypertension    I have asked that he start checking his blood pressure at home for the next 2 weeks.  He will follow-up for nurse BP check at that time.  Discussed the potential for medication for blood pressure depending on what his values are at home and his repeat value in the offices.      Relevant Orders   TSH   CBC   Elevated LFTs    Due for recheck.  Certainly could be related to fatty liver or prior heavy alcohol use.  I did encourage him to cut out his alcohol intake.      Relevant Orders   Comp Met (CMET)   Erectile dysfunction    Stable.  He can continue sildenafil as prescribed.      Obesity (BMI  35.0-39.9 without comorbidity)    Patient has had trouble losing weight despite dietary changes and consistent exercise.  Discussed decreasing alcohol intake and diet soda intake.  Discussed continuing exercise and dietary changes.  We will check lab work.  The patient wondered about mounjaro for weight loss.  He denies any personal or family history of thyroid cancer, parathyroid cancer, or adrenal gland cancer.  I discussed that this medication is not approved for weight loss though has been used for weight loss off label.  Discussed this may be a good option for him given cost concerns and their coupon card.  Discussed the risk of nausea, pancreatitis, and gallbladder disease with this medication.  Discussed the small risk of medullary thyroid cancer as that has been seen in rats studies though does not seem to occur in humans as of yet.  We will see what his labs reveal prior to considering medication for weight loss.      Relevant Orders   HgB A1c   Lipid panel   Seborrheic keratosis    Skin lesion is consistent with a seborrheic keratosis.  Advised he can monitor.  Discussed this was a benign finding.  If it changes or becomes painful or irritated he will let us know.       Return in about 2 weeks (around 03/19/2021) for Nurse BP check, 3 months PCP.  This visit occurred during the SARS-CoV-2 public health emergency.  Safety protocols were in place, including screening questions prior to the visit, additional usage of staff PPE, and extensive cleaning of exam room while observing appropriate contact time as indicated for disinfecting solutions.    Tommi Rumps, MD [place]

## 2021-03-05 NOTE — Patient Instructions (Addendum)
Nice to see you. We will get lab work today. Please try to continue to work on diet and exercise. Please try to cut out your alcohol and diet soda intake. If the spot on your back changes at all please let us know.

## 2021-03-05 NOTE — Assessment & Plan Note (Signed)
Stable.  He can continue sildenafil as prescribed.

## 2021-03-05 NOTE — Assessment & Plan Note (Signed)
Due for recheck.  Certainly could be related to fatty liver or prior heavy alcohol use.  I did encourage him to cut out his alcohol intake.

## 2021-03-05 NOTE — Assessment & Plan Note (Signed)
Patient has had trouble losing weight despite dietary changes and consistent exercise.  Discussed decreasing alcohol intake and diet soda intake.  Discussed continuing exercise and dietary changes.  We will check lab work.  The patient wondered about mounjaro for weight loss.  He denies any personal or family history of thyroid cancer, parathyroid cancer, or adrenal gland cancer.  I discussed that this medication is not approved for weight loss though has been used for weight loss off label.  Discussed this may be a good option for him given cost concerns and their coupon card.  Discussed the risk of nausea, pancreatitis, and gallbladder disease with this medication.  Discussed the small risk of medullary thyroid cancer as that has been seen in rats studies though does not seem to occur in humans as of yet.  We will see what his labs reveal prior to considering medication for weight loss.

## 2021-03-05 NOTE — Assessment & Plan Note (Signed)
Skin lesion is consistent with a seborrheic keratosis.  Advised he can monitor.  Discussed this was a benign finding.  If it changes or becomes painful or irritated he will let us know.

## 2021-03-05 NOTE — Assessment & Plan Note (Signed)
Asymptomatic.  He will continue Lexapro 20 mg once daily and trazodone 50-100 mg nightly as needed for sleep.  I did discuss cutting out the alcohol and caffeine before bed may be helpful for his sleep.

## 2021-03-11 ENCOUNTER — Telehealth: Payer: Self-pay | Admitting: Family Medicine

## 2021-03-11 NOTE — Telephone Encounter (Signed)
Patient returning your call.

## 2021-03-12 ENCOUNTER — Other Ambulatory Visit: Payer: Self-pay

## 2021-03-12 DIAGNOSIS — D582 Other hemoglobinopathies: Secondary | ICD-10-CM

## 2021-03-12 NOTE — Telephone Encounter (Signed)
I called the patient and gave lab results.  Quandarius Nill,cma

## 2021-03-13 ENCOUNTER — Other Ambulatory Visit: Payer: Self-pay | Admitting: Family Medicine

## 2021-03-13 DIAGNOSIS — R7989 Other specified abnormal findings of blood chemistry: Secondary | ICD-10-CM

## 2021-03-13 MED ORDER — TIRZEPATIDE 5 MG/0.5ML ~~LOC~~ SOAJ: 5.0000 mg | SUBCUTANEOUS | 0 refills | Status: DC

## 2021-03-13 MED ORDER — TIRZEPATIDE 2.5 MG/0.5ML ~~LOC~~ SOAJ: 2.5000 mg | SUBCUTANEOUS | 0 refills | Status: DC

## 2021-03-14 ENCOUNTER — Telehealth: Payer: Self-pay | Admitting: Family Medicine

## 2021-03-14 NOTE — Telephone Encounter (Signed)
Patient is returning your call from earlier,please call him at [phone].

## 2021-03-14 NOTE — Telephone Encounter (Signed)
LVM for patient to call back for lab results.  Zoraya Fiorenza,cma

## 2021-03-19 ENCOUNTER — Ambulatory Visit (INDEPENDENT_AMBULATORY_CARE_PROVIDER_SITE_OTHER): Payer: BC Managed Care – PPO

## 2021-03-19 ENCOUNTER — Other Ambulatory Visit: Payer: Self-pay

## 2021-03-19 VITALS — BP 160/102

## 2021-03-19 DIAGNOSIS — I10 Essential (primary) hypertension: Secondary | ICD-10-CM | POA: Diagnosis not present

## 2021-03-19 NOTE — Progress Notes (Signed)
Patient here for nurse visit BP check per order from [place].   Patient reports compliance with prescribed BP medications: Pt is not on BP meds    BP Readings from Last 3 Encounters:  03/19/21 (!) 160/102  03/05/21 (!) 150/90  08/31/20 130/80   Pulse Readings from Last 3 Encounters:  03/05/21 77  08/31/20 85  05/16/20 76    Any Headaches? Dizziness? Light Headedness? No  Patient brought in some bood pressure readings he took over the last few days [place], [place]

## 2021-03-22 NOTE — Progress Notes (Signed)
LMTCB

## 2021-03-26 NOTE — Progress Notes (Signed)
LMTCB

## 2021-03-27 ENCOUNTER — Other Ambulatory Visit: Payer: BC Managed Care – PPO

## 2021-03-29 ENCOUNTER — Other Ambulatory Visit (INDEPENDENT_AMBULATORY_CARE_PROVIDER_SITE_OTHER): Payer: BC Managed Care – PPO

## 2021-03-29 ENCOUNTER — Other Ambulatory Visit: Payer: Self-pay | Admitting: Family Medicine

## 2021-03-29 ENCOUNTER — Other Ambulatory Visit: Payer: Self-pay

## 2021-03-29 DIAGNOSIS — D582 Other hemoglobinopathies: Secondary | ICD-10-CM | POA: Diagnosis not present

## 2021-03-29 LAB — CBC
HCT: 50.9 % (ref 39.0–52.0)
Hemoglobin: 17.2 g/dL — ABNORMAL HIGH (ref 13.0–17.0)
MCHC: 33.9 g/dL (ref 30.0–36.0)
MCV: 93.3 fl (ref 78.0–100.0)
Platelets: 234 10*3/uL (ref 150.0–400.0)
RBC: 5.45 Mil/uL (ref 4.22–5.81)
RDW: 13 % (ref 11.5–15.5)
WBC: 6.4 10*3/uL (ref 4.0–10.5)

## 2021-04-09 ENCOUNTER — Other Ambulatory Visit: Payer: BC Managed Care – PPO

## 2021-04-15 IMAGING — CR DG CHEST 2V
2 series · 2 of 2 positions shown · non-contrast
Comparison: None.

CLINICAL DATA: Shortness of breath and anxiety.

EXAM:
CHEST - 2 VIEW

[chest pa]
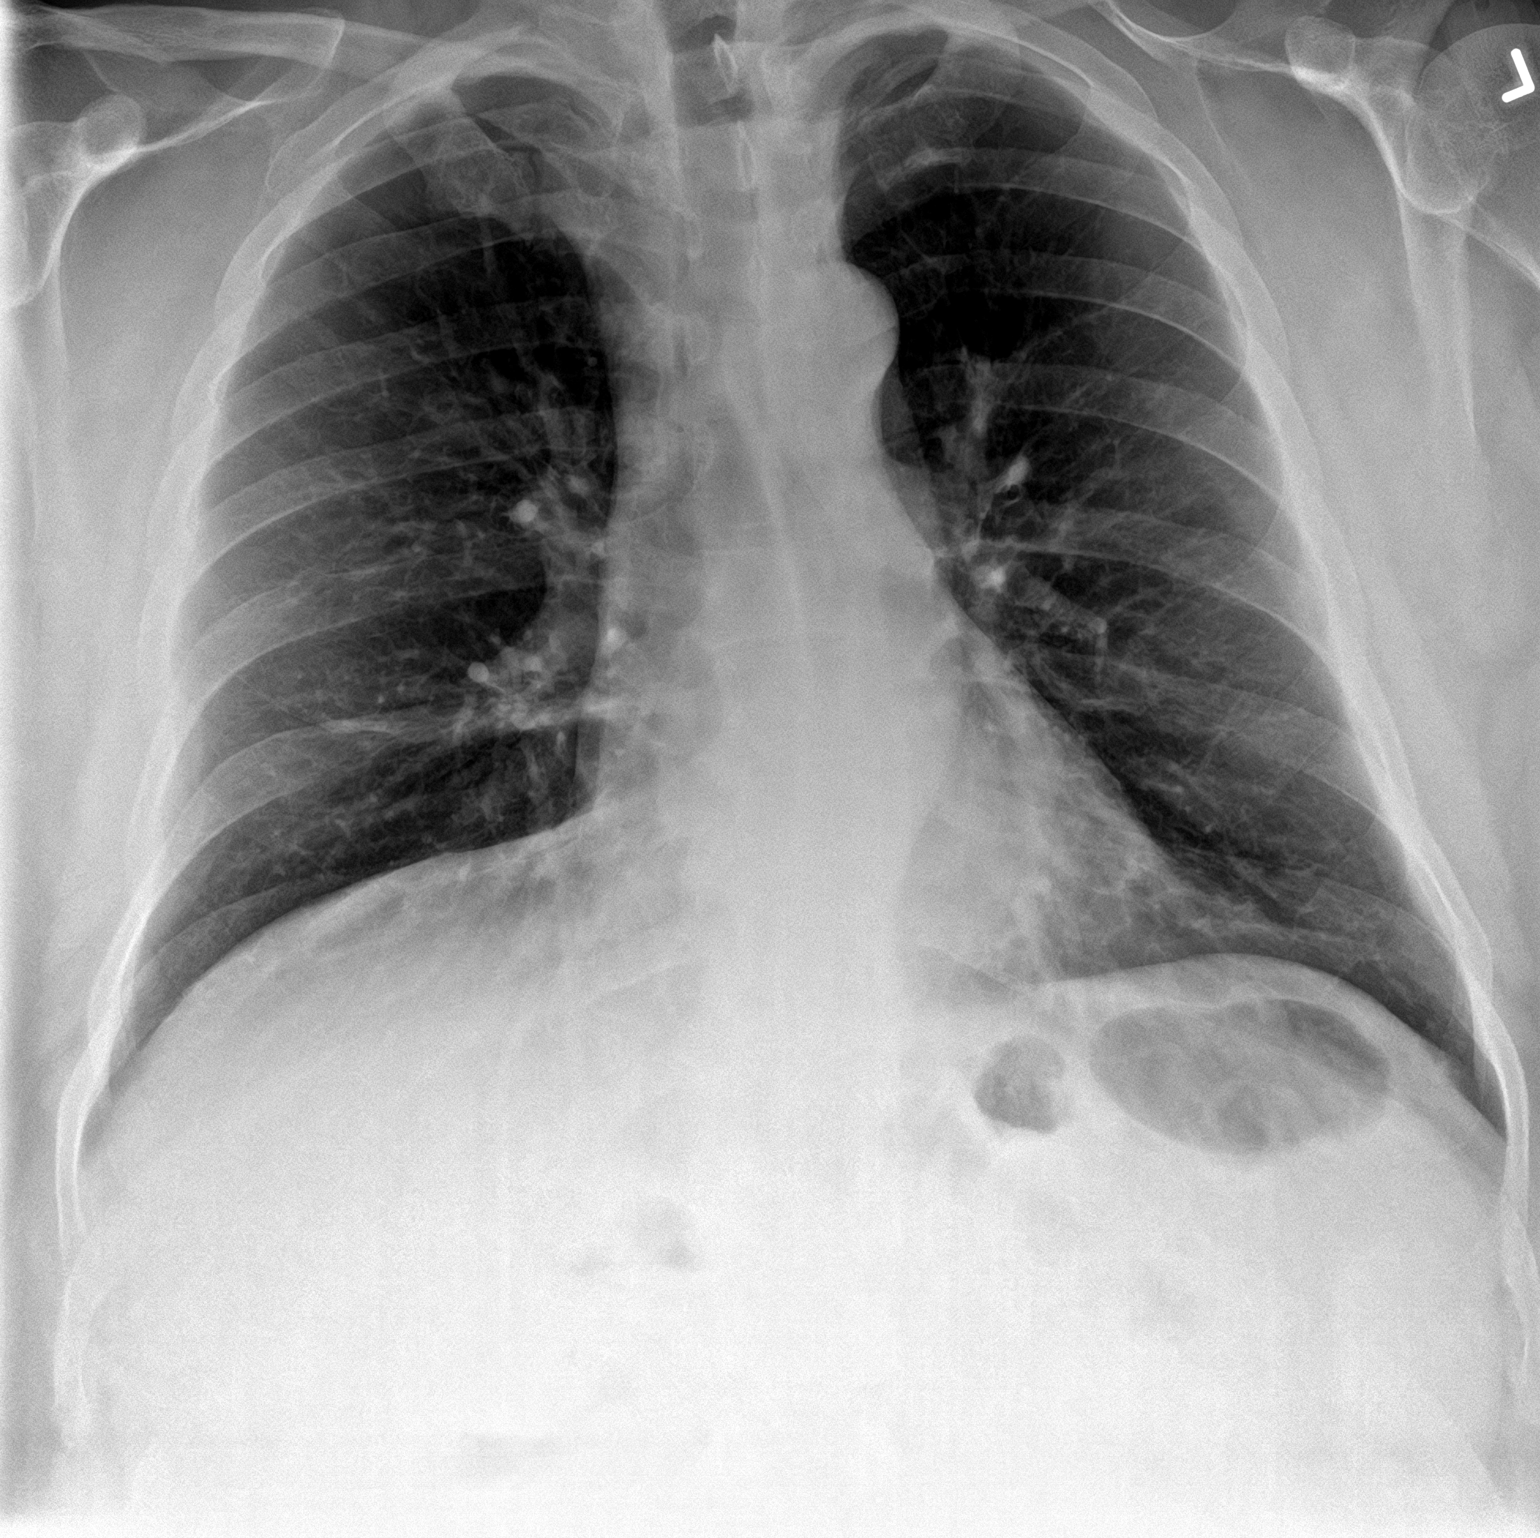

[chest lat]
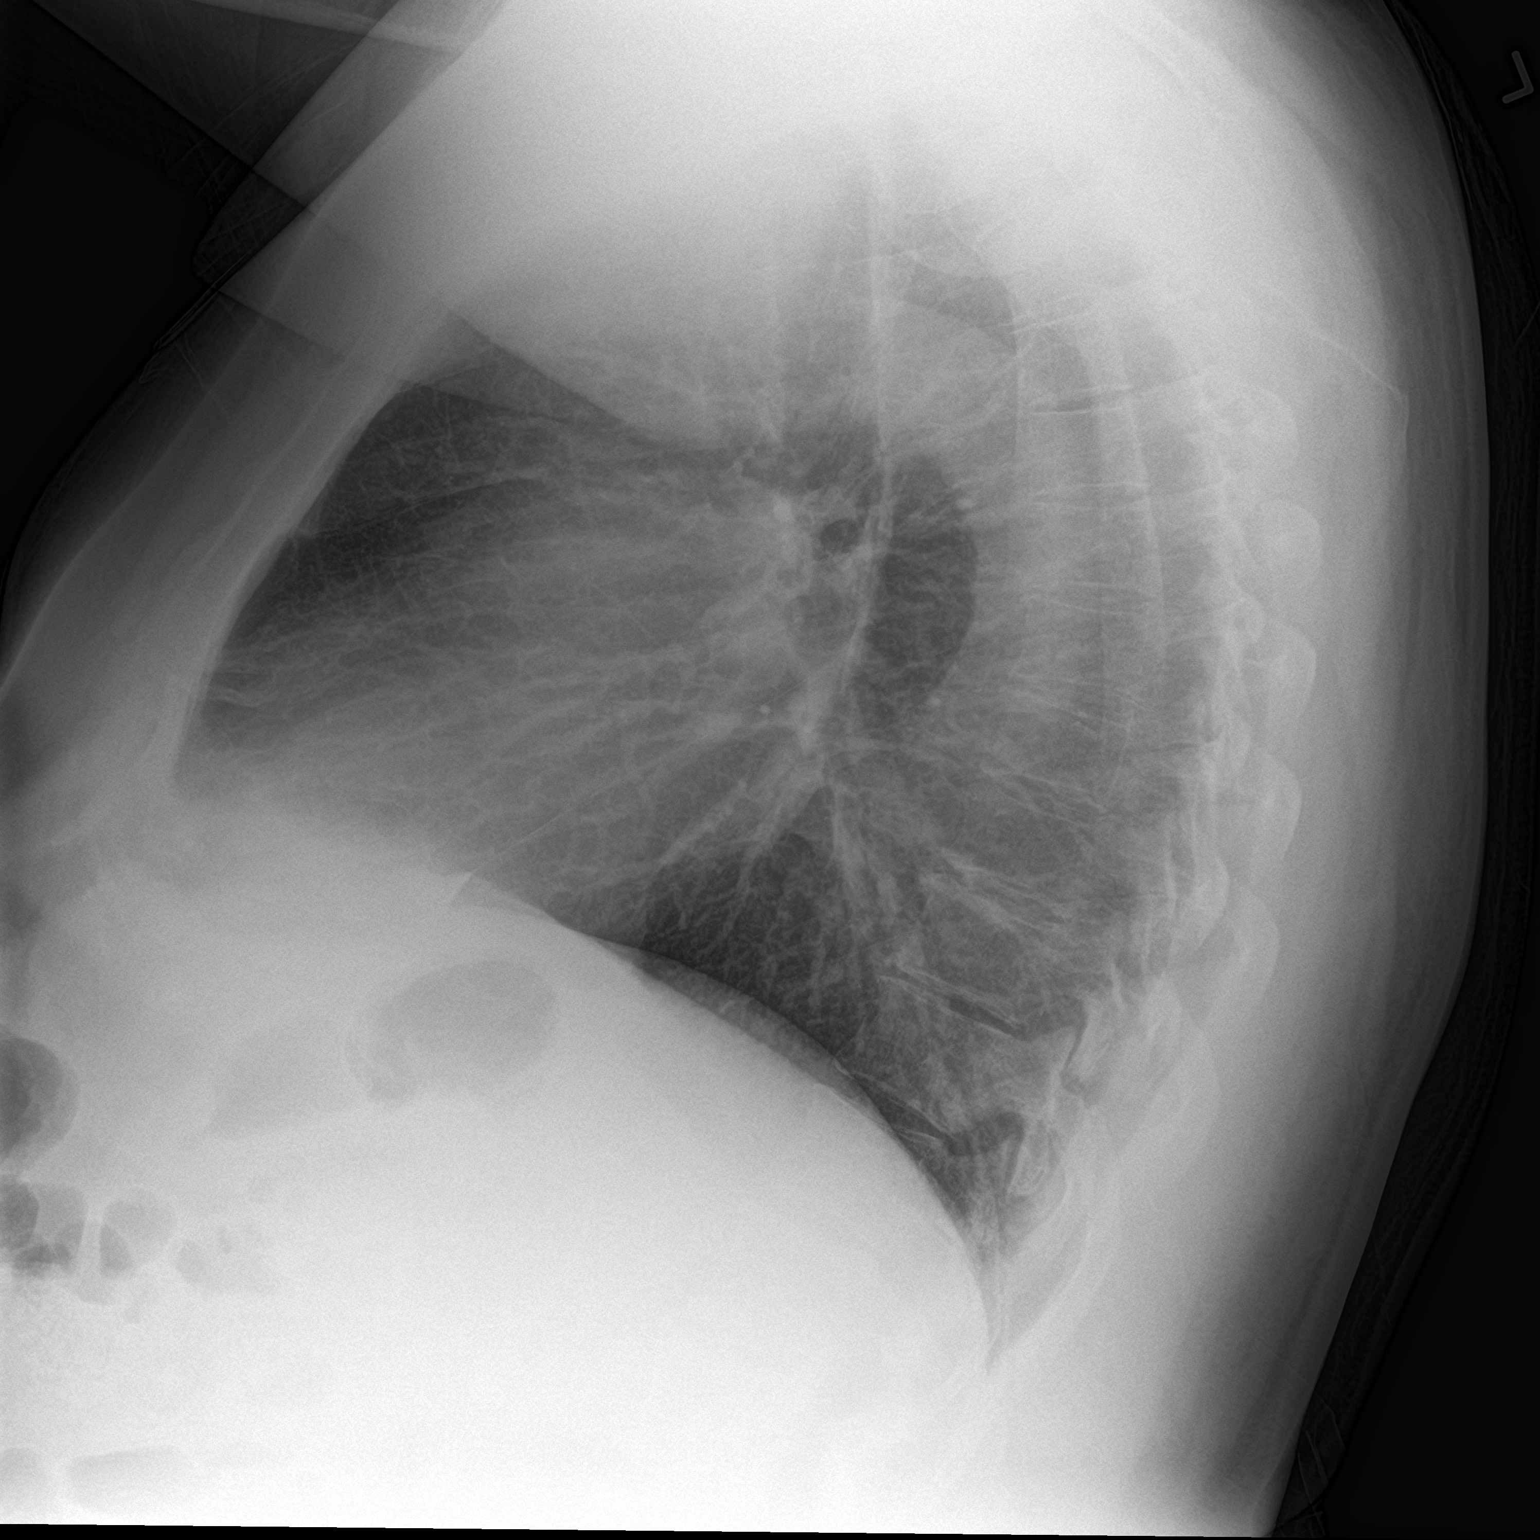

[2 of 2 positions shown; findings below may reference images not displayed]

FINDINGS: Very mild linear atelectasis is seen within the bilateral lung
bases. There is no evidence of a pleural effusion or pneumothorax.
The heart size and mediastinal contours are within normal limits.
There is mild tortuosity of the descending thoracic aorta. The
visualized skeletal structures are unremarkable.
IMPRESSION: No active cardiopulmonary disease.

## 2021-04-16 ENCOUNTER — Other Ambulatory Visit (INDEPENDENT_AMBULATORY_CARE_PROVIDER_SITE_OTHER): Payer: BC Managed Care – PPO

## 2021-04-16 ENCOUNTER — Other Ambulatory Visit: Payer: Self-pay

## 2021-04-16 DIAGNOSIS — D582 Other hemoglobinopathies: Secondary | ICD-10-CM | POA: Diagnosis not present

## 2021-04-16 LAB — CBC
HCT: 51.3 % (ref 39.0–52.0)
Hemoglobin: 17.1 g/dL — ABNORMAL HIGH (ref 13.0–17.0)
MCHC: 33.3 g/dL (ref 30.0–36.0)
MCV: 93.2 fl (ref 78.0–100.0)
Platelets: 219 10*3/uL (ref 150.0–400.0)
RBC: 5.5 Mil/uL (ref 4.22–5.81)
RDW: 12.6 % (ref 11.5–15.5)
WBC: 5.9 10*3/uL (ref 4.0–10.5)

## 2021-04-18 ENCOUNTER — Telehealth: Payer: Self-pay

## 2021-04-19 NOTE — Telephone Encounter (Signed)
The form for mounjaro was signed and faxed today, confirmation given.  Destan Franchini,cma

## 2021-04-19 NOTE — Telephone Encounter (Signed)
I do not think this PA will get approved as this is an off label prescription for mounjaro use for obesity. I filled it in and it could be submitted, though I have low expectations that it will be approved.

## 2021-05-27 ENCOUNTER — Other Ambulatory Visit: Payer: Self-pay

## 2021-05-27 ENCOUNTER — Ambulatory Visit: Payer: BC Managed Care – PPO | Admitting: Family Medicine

## 2021-05-27 ENCOUNTER — Encounter: Payer: Self-pay | Admitting: Family Medicine

## 2021-05-27 DIAGNOSIS — F32A Depression, unspecified: Secondary | ICD-10-CM

## 2021-05-27 DIAGNOSIS — E669 Obesity, unspecified: Secondary | ICD-10-CM

## 2021-05-27 DIAGNOSIS — F419 Anxiety disorder, unspecified: Secondary | ICD-10-CM

## 2021-05-27 DIAGNOSIS — R7989 Other specified abnormal findings of blood chemistry: Secondary | ICD-10-CM | POA: Diagnosis not present

## 2021-05-27 NOTE — Progress Notes (Signed)
Tommi Rumps, MD Phone: [phone]  Connor Byrd is a 50 y.o. male who presents today for follow-up.  Elevated LFTs: Patient notes no right upper quadrant pain.  He completely quit alcohol when his wife had her event back in October.  He notes no Tylenol use.  He has not seen GI yet given the issues as outlined below.  Anxiety/depression: Patient has no depression though does note anxiety.  He thinks this is reasonable anxiety given what he is going through with his wife.  His wife had a stroke and then had liver failure.  She has been hospitalized since October 18.  It sounds as though she had a cardiac arrest during that timeframe and he is not sure if she is going to make it.  He continues on Lexapro 30 mg once daily and as needed trazodone.  Obesity: Patient's weight has trended down from 292 pounds to 269 pounds.  The mounjaro has been beneficial.  He has had no nausea or abdominal pain.  He has diarrhea the day after he takes it though this is not bothersome.  He has not been able to exercise given its been going on with his wife.  His diet has been hit or miss given what is going on with his wife.  He again confirms that he has no personal or family history of thyroid cancer, parathyroid cancer, or adrenal gland cancer.  No history of pancreatitis or gallbladder issues.  Social History   Tobacco Use  Smoking Status Former   Types: Cigarettes   Quit date: 01/03/2016   Years since quitting: 5.4  Smokeless Tobacco Never    Current Outpatient Medications on File Prior to Visit  Medication Sig Dispense Refill   dexlansoprazole (DEXILANT) 60 MG capsule 1     escitalopram (LEXAPRO) 20 MG tablet Take 1.5 tablets (30 mg total) by mouth daily. 135 tablet 1   sildenafil (REVATIO) 20 MG tablet Take 1 to 5 tabs PO daily prn 30 tablet 0   tirzepatide (MOUNJARO) 2.5 MG/0.5ML Pen Inject 2.5 mg into the skin once a week. For 4 weeks. 2 mL 0   tirzepatide (MOUNJARO) 5 MG/0.5ML Pen Inject 5  mg into the skin once a week. Start after completing the 2.5 mg dose. 6 mL 0   traZODone (DESYREL) 50 MG tablet TAKE ONE TO TWO TABLETS BY MOUTH EVERY NIGHT AT BEDTIME AS NEEDED FOR SLEEP 90 tablet 1   No current facility-administered medications on file prior to visit.     ROS see history of present illness  Objective  Physical Exam Vitals:   05/27/21 0939  BP: 110/80  Pulse: 73  Temp: 98.1 F (36.7 C)  SpO2: 98%    BP Readings from Last 3 Encounters:  05/27/21 110/80  03/19/21 (!) 160/102  03/05/21 (!) 150/90   Wt Readings from Last 3 Encounters:  05/27/21 269 lb 12.8 oz (122.4 kg)  03/05/21 292 lb 9.6 oz (132.7 kg)  08/31/20 296 lb 12.8 oz (134.6 kg)    Physical Exam Constitutional:      General: He is not in acute distress.    Appearance: He is not diaphoretic.  Neck:     Thyroid: No thyroid mass, thyromegaly or thyroid tenderness.  Cardiovascular:     Rate and Rhythm: Normal rate and regular rhythm.     Heart sounds: Normal heart sounds.  Pulmonary:     Effort: Pulmonary effort is normal.     Breath sounds: Normal breath sounds.  Musculoskeletal:  Right lower leg: No edema.     Left lower leg: No edema.  Skin:    General: Skin is warm and dry.  Neurological:     Mental Status: He is alert.     Assessment/Plan: Please see individual problem list.  Problem List Items Addressed This Visit     Anxiety and depression    Seems to be generally well controlled given what has been going on in his life for the last 2 months.  He will continue Lexapro 30 mg once daily.  He can continue as needed trazodone.  He notes he is already in counseling.  He will let us know if he needs anything regarding his anxiety.      Elevated LFTs    Congratulated on cessation of alcohol intake.  I encouraged him to contact GI to schedule an evaluation when he is able to.  Discussed that they would help evaluate whether or not this was related to fatty liver or some other  cause.      Obesity (BMI 35.0-39.9 without comorbidity)    Patient's weight has trended down quite a bit with use of mounjaro.  He can continue on this medication at this time.  Advised of risk of medullary thyroid cancer seen in rats studies as well as the risk of pancreatitis and gallbladder issues.  Advised if he noticed anything in the area of his thyroid or if he developed excessive nausea he would need to let us know.  Advised if he developed abdominal pain he would need to discontinue this medication and seek medical attention.  I did encourage him to get physical activity and monitor his diet as he is able to given his current life situation.       Return in about 3 months (around 08/25/2021) for Weight.  This visit occurred during the SARS-CoV-2 public health emergency.  Safety protocols were in place, including screening questions prior to the visit, additional usage of staff PPE, and extensive cleaning of exam room while observing appropriate contact time as indicated for disinfecting solutions.    Tommi Rumps, MD [place]

## 2021-05-27 NOTE — Assessment & Plan Note (Addendum)
Patient's weight has trended down quite a bit with use of mounjaro.  He can continue on this medication at this time.  Advised of risk of medullary thyroid cancer seen in rats studies as well as the risk of pancreatitis and gallbladder issues.  Advised if he noticed anything in the area of his thyroid or if he developed excessive nausea he would need to let us know.  Advised if he developed abdominal pain he would need to discontinue this medication and seek medical attention.  I did encourage him to get physical activity and monitor his diet as he is able to given his current life situation.

## 2021-05-27 NOTE — Assessment & Plan Note (Signed)
Congratulated on cessation of alcohol intake.  I encouraged him to contact GI to schedule an evaluation when he is able to.  Discussed that they would help evaluate whether or not this was related to fatty liver or some other cause.

## 2021-05-27 NOTE — Patient Instructions (Addendum)
Nice to see you. Please continue to avoid alcohol. Please call GI to schedule an appointment when able to. Please monitor for nausea and abdominal pain with the mounjaro.  If you develop abdominal pain with this you need to seek medical attention immediately and discontinue this medication.

## 2021-05-27 NOTE — Assessment & Plan Note (Signed)
Seems to be generally well controlled given what has been going on in his life for the last 2 months.  He will continue Lexapro 30 mg once daily.  He can continue as needed trazodone.  He notes he is already in counseling.  He will let us know if he needs anything regarding his anxiety.

## 2021-07-09 ENCOUNTER — Other Ambulatory Visit: Payer: Self-pay | Admitting: Family Medicine

## 2021-07-09 NOTE — Telephone Encounter (Signed)
I called the patient and asked for his new insurance information, he stated he would bring it by the office today so I can do the PA.  Jametta [place],cma

## 2021-07-10 ENCOUNTER — Telehealth: Payer: Self-pay

## 2021-07-10 NOTE — Telephone Encounter (Signed)
I called and spoke with the patient and he stated he received his Mounjaro on yesterday with no problem.  Gae Bon.,cma

## 2021-07-10 NOTE — Telephone Encounter (Signed)
I started a PA for the mounjaro and the patients benefit plan covers alternatives: the available alternatives are Ozempic, rybelsus, Trulicity and Victoza, you would need to write a new Rx for one of theses medications and send to the pharmacy.  Awab Abebe,cma

## 2021-07-10 NOTE — Telephone Encounter (Signed)
Noted. If he has issues getting this moving forward he should let us know.

## 2021-07-10 NOTE — Telephone Encounter (Signed)
This medication is for obesity and not diabetes so it is unlikely that they would cover the other ones for a diagnosis of obesity.  Can you find out if he has been able to get the mounjaro in 2023?

## 2021-08-19 ENCOUNTER — Telehealth: Payer: Self-pay

## 2021-08-19 NOTE — Telephone Encounter (Signed)
Connor Byrd was denied the alternative from the insurance is Ozempic,Rybelsus, trulicity or victoza.  Connor Byrd,cma  ?

## 2021-08-19 NOTE — Telephone Encounter (Signed)
Noted.  Please let the patient know.  The other ones are not indicated at this time as he does not have diabetes.  The [name] was being used for weight loss off label. ?

## 2021-08-20 NOTE — Telephone Encounter (Signed)
LVM for patient to call back.   Connor Byrd,cma  

## 2021-08-21 NOTE — Telephone Encounter (Signed)
Pt returning call

## 2021-08-23 NOTE — Telephone Encounter (Signed)
I called and spoke with the patient and informed him that his [name] was denied and that it was because he was not a diabetic and he understood.  Connor Byrd,cma  ?

## 2021-08-26 ENCOUNTER — Encounter: Payer: Self-pay | Admitting: Family Medicine

## 2021-08-26 ENCOUNTER — Other Ambulatory Visit: Payer: Self-pay

## 2021-08-26 ENCOUNTER — Ambulatory Visit: Payer: BC Managed Care – PPO | Admitting: Family Medicine

## 2021-08-26 DIAGNOSIS — F32A Depression, unspecified: Secondary | ICD-10-CM

## 2021-08-26 DIAGNOSIS — Z8719 Personal history of other diseases of the digestive system: Secondary | ICD-10-CM | POA: Diagnosis not present

## 2021-08-26 DIAGNOSIS — F419 Anxiety disorder, unspecified: Secondary | ICD-10-CM | POA: Diagnosis not present

## 2021-08-26 DIAGNOSIS — E669 Obesity, unspecified: Secondary | ICD-10-CM | POA: Diagnosis not present

## 2021-08-26 DIAGNOSIS — N529 Male erectile dysfunction, unspecified: Secondary | ICD-10-CM

## 2021-08-26 MED ORDER — TADALAFIL 5 MG PO TABS: 2.5000 mg | ORAL_TABLET | Freq: Every day | ORAL | 1 refills | Status: DC | PRN

## 2021-08-26 NOTE — Assessment & Plan Note (Signed)
No recent issues with this.  He will monitor for recurrence. ?

## 2021-08-26 NOTE — Assessment & Plan Note (Signed)
His weight has trended down significantly with use of Mounjaro.  This will no longer be available to him.  He was encouraged to start exercising 2 to 4 days/week and work his way up with intensity.  He will continue healthy diet. ?

## 2021-08-26 NOTE — Assessment & Plan Note (Signed)
Viagra has been beneficial though is not great for spontaneity.  We will switch him to daily Cialis and see if that works better.  He notes no chest pain or shortness of breath.  He was advised to seek medical attention if he develops chest pain or shortness of breath during intercourse.  Discussed he would need to let the hospital or emergency medical providers know that he takes Cialis daily if he ever had to seek medical attention.  Discussed if he developed an erection that would not resolve he would need to seek medical attention as well. ?

## 2021-08-26 NOTE — Progress Notes (Signed)
?Tommi Rumps, MD ?Phone: [phone] ? ?Connor Byrd is a 51 y.o. male who presents today for f/u. ? ?Obesity: The patient's weight has trended down to 243 pounds.  He is eating healthier.  Eating more vegetables.  He is drinking sugar-free soda.  He does eat some sweets.  Notes his craving for food has reduced.  He joined a gym though has not consistently started going.  His insurance will not cover Mounjaro for weight loss.  He will come off of this after 2 more doses. ? ?Anxiety/depression: Patient reports no depression.  His wife did pass away after having numerous medical issues.  He notes having panic attacks that he treats with meditation.  He feels as though the Lexapro and trazodone have been beneficial.  The trazodone helps with sleep.  He is dealing with lawyers and lots of medical bills from his wife. ? ?Erectile dysfunction: The patient has started dating again.  He notes Viagra works well but does not work great for spontaneity.  He requests something different to use. ? ?History of GERD: Patient reports he is not on medication for this.  He has not had any reflux issues in quite some time. ? ?Social History  ? ?Tobacco Use  ?Smoking Status Former  ? Types: Cigarettes  ? Quit date: 01/03/2016  ? Years since quitting: 5.6  ?Smokeless Tobacco Never  ? ? ?Current Outpatient Medications on File Prior to Visit  ?Medication Sig Dispense Refill  ? escitalopram (LEXAPRO) 20 MG tablet Take 1.5 tablets (30 mg total) by mouth daily. 135 tablet 1  ? traZODone (DESYREL) 50 MG tablet TAKE ONE TO TWO TABLETS BY MOUTH EVERY NIGHT AT BEDTIME AS NEEDED FOR SLEEP 90 tablet 1  ? ?No current facility-administered medications on file prior to visit.  ? ? ? ?ROS see history of present illness ? ?Objective ? ?Physical Exam ?Vitals:  ? 08/26/21 0932  ?BP: 130/70  ?Pulse: 74  ?Temp: 97.8 ?F (36.6 ?C)  ?SpO2: 97%  ? ? ?BP Readings from Last 3 Encounters:  ?08/26/21 130/70  ?05/27/21 110/80  ?03/19/21 (!) 160/102  ? ?Wt  Readings from Last 3 Encounters:  ?08/26/21 243 lb 3.2 oz (110.3 kg)  ?05/27/21 269 lb 12.8 oz (122.4 kg)  ?03/05/21 292 lb 9.6 oz (132.7 kg)  ? ? ?Physical Exam ?Constitutional:   ?   General: He is not in acute distress. ?   Appearance: He is not diaphoretic.  ?Cardiovascular:  ?   Rate and Rhythm: Normal rate and regular rhythm.  ?   Heart sounds: Normal heart sounds.  ?Pulmonary:  ?   Effort: Pulmonary effort is normal.  ?   Breath sounds: Normal breath sounds.  ?Skin: ?   General: Skin is warm and dry.  ?Neurological:  ?   Mental Status: He is alert.  ? ? ? ?Assessment/Plan: Please see individual problem list. ? ?Problem List Items Addressed This Visit   ? ? Anxiety and depression  ?  Patient has had panic attacks since his wife passed away.  He is using meditation to treat those and he can continue doing that.  He will continue on Lexapro 30 mg once daily and trazodone 50-100 mg nightly for sleep.  He will let us know if he feels as though he needs a change to his medication regimen moving forward.  He will continue to see his therapist. ?  ?  ? Erectile dysfunction  ?  Viagra has been beneficial though is not great for  spontaneity.  We will switch him to daily Cialis and see if that works better.  He notes no chest pain or shortness of breath.  He was advised to seek medical attention if he develops chest pain or shortness of breath during intercourse.  Discussed he would need to let the hospital or emergency medical providers know that he takes Cialis daily if he ever had to seek medical attention.  Discussed if he developed an erection that would not resolve he would need to seek medical attention as well. ?  ?  ? Relevant Medications  ? tadalafil (CIALIS) 5 MG tablet  ? History of gastroesophageal reflux (GERD)  ?  No recent issues with this.  He will monitor for recurrence. ?  ?  ? Obesity (BMI 30.0-34.9)  ?  His weight has trended down significantly with use of Mounjaro.  This will no longer be available  to him.  He was encouraged to start exercising 2 to 4 days/week and work his way up with intensity.  He will continue healthy diet. ?  ?  ? ? ? ?Health Maintenance: Discussed completing a colonoscopy.  The patient defers this to his next visit given all that is going on surrounding the death of his wife. ? ?Return in about 3 months (around 11/26/2021) for Weight/anxiety. ? ?This visit occurred during the SARS-CoV-2 public health emergency.  Safety protocols were in place, including screening questions prior to the visit, additional usage of staff PPE, and extensive cleaning of exam room while observing appropriate contact time as indicated for disinfecting solutions.  ? ? ?Tommi Rumps, MD ?[place] ? ?

## 2021-08-26 NOTE — Assessment & Plan Note (Signed)
Patient has had panic attacks since his wife passed away.  He is using meditation to treat those and he can continue doing that.  He will continue on Lexapro 30 mg once daily and trazodone 50-100 mg nightly for sleep.  He will let us know if he feels as though he needs a change to his medication regimen moving forward.  He will continue to see his therapist. ?

## 2021-08-26 NOTE — Patient Instructions (Signed)
Nice to see you. ?I sent Cialis 2.5 mg in for you to take once daily.  If this is not beneficial please let me know. ?Please add an exercise consistently to help with weight loss. ?

## 2021-10-03 ENCOUNTER — Other Ambulatory Visit: Payer: Self-pay | Admitting: Family Medicine

## 2021-10-03 DIAGNOSIS — F32A Depression, unspecified: Secondary | ICD-10-CM

## 2021-11-29 ENCOUNTER — Ambulatory Visit: Payer: BC Managed Care – PPO | Admitting: Family Medicine

## 2021-11-29 ENCOUNTER — Encounter: Payer: Self-pay | Admitting: Family Medicine

## 2021-11-29 VITALS — BP 118/80 | HR 69 | Temp 98.1°F | Ht 72.0 in | Wt 240.6 lb

## 2021-11-29 DIAGNOSIS — Z1211 Encounter for screening for malignant neoplasm of colon: Secondary | ICD-10-CM

## 2021-11-29 DIAGNOSIS — R21 Rash and other nonspecific skin eruption: Secondary | ICD-10-CM | POA: Insufficient documentation

## 2021-11-29 DIAGNOSIS — N529 Male erectile dysfunction, unspecified: Secondary | ICD-10-CM | POA: Diagnosis not present

## 2021-11-29 DIAGNOSIS — F419 Anxiety disorder, unspecified: Secondary | ICD-10-CM

## 2021-11-29 DIAGNOSIS — R0681 Apnea, not elsewhere classified: Secondary | ICD-10-CM | POA: Insufficient documentation

## 2021-11-29 DIAGNOSIS — E669 Obesity, unspecified: Secondary | ICD-10-CM

## 2021-11-29 DIAGNOSIS — K625 Hemorrhage of anus and rectum: Secondary | ICD-10-CM | POA: Insufficient documentation

## 2021-11-29 DIAGNOSIS — F32A Depression, unspecified: Secondary | ICD-10-CM

## 2021-11-29 DIAGNOSIS — K219 Gastro-esophageal reflux disease without esophagitis: Secondary | ICD-10-CM | POA: Insufficient documentation

## 2021-11-29 DIAGNOSIS — R1013 Epigastric pain: Secondary | ICD-10-CM | POA: Insufficient documentation

## 2021-11-29 MED ORDER — HYDROXYZINE HCL 10 MG PO TABS: 10.0000 mg | ORAL_TABLET | Freq: Three times a day (TID) | ORAL | 0 refills | Status: DC | PRN

## 2021-11-29 NOTE — Assessment & Plan Note (Signed)
We will order home sleep study.  He was advised to contact us if he does not hear about this in the next 1 to 2 weeks.

## 2021-12-02 ENCOUNTER — Telehealth: Payer: Self-pay

## 2021-12-02 NOTE — Telephone Encounter (Signed)
CALLED PATIENT NO ANSWER LEFT VOICEMAIL FOR A CALL BACK ? ?

## 2021-12-04 ENCOUNTER — Other Ambulatory Visit: Payer: Self-pay

## 2021-12-04 DIAGNOSIS — Z1211 Encounter for screening for malignant neoplasm of colon: Secondary | ICD-10-CM

## 2021-12-04 MED ORDER — NA SULFATE-K SULFATE-MG SULF 17.5-3.13-1.6 GM/177ML PO SOLN: 1.0000 | Freq: Once | ORAL | 0 refills | Status: AC

## 2021-12-04 NOTE — Progress Notes (Signed)
Gastroenterology Pre-Procedure Review  Request Date: 01/03/2022 Requesting Physician: Dr. Marius Ditch  PATIENT REVIEW QUESTIONS: The patient responded to the following health history questions as indicated:    1. Are you having any GI issues? no 2. Do you have a personal history of Polyps? no 3. Do you have a family history of Colon Cancer or Polyps? yes (mother had rectal cancer) 4. Diabetes Mellitus? no 5. Joint replacements in the past 12 months?no 6. Major health problems in the past 3 months?no 7. Any artificial heart valves, MVP, or defibrillator?no    MEDICATIONS & ALLERGIES:    Patient reports the following regarding taking any anticoagulation/antiplatelet therapy:   Plavix, Coumadin, Eliquis, Xarelto, Lovenox, Pradaxa, Brilinta, or Effient? no Aspirin? no  Patient confirms/reports the following medications:  Current Outpatient Medications  Medication Sig Dispense Refill   escitalopram (LEXAPRO) 20 MG tablet TAKE 1 AND 1/2 TABLET BY MOUTH DAILY 135 tablet 1   hydrOXYzine (ATARAX) 10 MG tablet Take 1 tablet (10 mg total) by mouth 3 (three) times daily as needed for anxiety. 30 tablet 0   tadalafil (CIALIS) 5 MG tablet Take 0.5 tablets (2.5 mg total) by mouth daily as needed for erectile dysfunction. 45 tablet 1   traZODone (DESYREL) 50 MG tablet TAKE ONE TO TWO TABLETS BY MOUTH EVERY NIGHT AT BEDTIME AS NEEDED FOR SLEEP 90 tablet 1   No current facility-administered medications for this visit.    Patient confirms/reports the following allergies:  No Known Allergies  No orders of the defined types were placed in this encounter.   AUTHORIZATION INFORMATION Primary Insurance: 1D#: Group #:  Secondary Insurance: 1D#: Group #:  SCHEDULE INFORMATION: Date: 01/03/2022 Time: Location: [facility]

## 2021-12-05 ENCOUNTER — Other Ambulatory Visit: Payer: Self-pay | Admitting: Family Medicine

## 2021-12-05 DIAGNOSIS — F419 Anxiety disorder, unspecified: Secondary | ICD-10-CM

## 2022-01-03 ENCOUNTER — Ambulatory Visit: Payer: BC Managed Care – PPO | Admitting: Anesthesiology

## 2022-01-03 ENCOUNTER — Ambulatory Visit
Admission: RE | Admit: 2022-01-03 | Discharge: 2022-01-03 | Disposition: A | Discharge status: Home / Self Care | Payer: BC Managed Care – PPO | Attending: Gastroenterology | Admitting: Gastroenterology

## 2022-01-03 ENCOUNTER — Encounter
Admission: RE | Disposition: A | Discharge status: Home / Self Care | Payer: Self-pay | Source: Home / Self Care | Attending: Gastroenterology

## 2022-01-03 ENCOUNTER — Encounter: Payer: Self-pay | Admitting: Gastroenterology

## 2022-01-03 ENCOUNTER — Other Ambulatory Visit: Payer: Self-pay

## 2022-01-03 DIAGNOSIS — Z87891 Personal history of nicotine dependence: Secondary | ICD-10-CM | POA: Insufficient documentation

## 2022-01-03 DIAGNOSIS — Z1211 Encounter for screening for malignant neoplasm of colon: Secondary | ICD-10-CM

## 2022-01-03 DIAGNOSIS — D124 Benign neoplasm of descending colon: Secondary | ICD-10-CM | POA: Diagnosis not present

## 2022-01-03 DIAGNOSIS — K635 Polyp of colon: Secondary | ICD-10-CM | POA: Diagnosis not present

## 2022-01-03 DIAGNOSIS — F419 Anxiety disorder, unspecified: Secondary | ICD-10-CM | POA: Diagnosis not present

## 2022-01-03 DIAGNOSIS — G4700 Insomnia, unspecified: Secondary | ICD-10-CM | POA: Insufficient documentation

## 2022-01-03 DIAGNOSIS — K644 Residual hemorrhoidal skin tags: Secondary | ICD-10-CM | POA: Insufficient documentation

## 2022-01-03 DIAGNOSIS — D123 Benign neoplasm of transverse colon: Secondary | ICD-10-CM | POA: Diagnosis not present

## 2022-01-03 DIAGNOSIS — F32A Depression, unspecified: Secondary | ICD-10-CM | POA: Insufficient documentation

## 2022-01-03 HISTORY — PX: COLONOSCOPY WITH PROPOFOL: SHX5780

## 2022-01-03 SURGERY — COLONOSCOPY WITH PROPOFOL
Anesthesia: General

## 2022-01-03 MED ORDER — MIDAZOLAM HCL 2 MG/2ML IJ SOLN
INTRAMUSCULAR | Status: AC
  Filled 2022-01-03: qty 2

## 2022-01-03 MED ORDER — PROPOFOL 500 MG/50ML IV EMUL
INTRAVENOUS | Status: DC | PRN
  Administered 2022-01-03: 150 ug/kg/min via INTRAVENOUS

## 2022-01-03 MED ORDER — SODIUM CHLORIDE 0.9 % IV SOLN: INTRAVENOUS | Status: DC

## 2022-01-03 MED ORDER — KETAMINE HCL 10 MG/ML IJ SOLN
INTRAMUSCULAR | Status: DC | PRN
  Administered 2022-01-03: 10 mg via INTRAVENOUS

## 2022-01-03 MED ORDER — SODIUM CHLORIDE 0.9 % IV SOLN: INTRAVENOUS | Status: DC | PRN

## 2022-01-03 MED ORDER — FENTANYL CITRATE (PF) 100 MCG/2ML IJ SOLN
INTRAMUSCULAR | Status: AC
  Filled 2022-01-03: qty 2

## 2022-01-03 MED ORDER — PROPOFOL 10 MG/ML IV BOLUS
INTRAVENOUS | Status: DC | PRN
  Administered 2022-01-03 (×2): 50 mg via INTRAVENOUS

## 2022-01-03 MED ORDER — KETAMINE HCL 50 MG/5ML IJ SOSY
PREFILLED_SYRINGE | INTRAMUSCULAR | Status: AC
  Filled 2022-01-03: qty 5

## 2022-01-03 MED ORDER — MIDAZOLAM HCL 2 MG/2ML IJ SOLN
INTRAMUSCULAR | Status: DC | PRN
  Administered 2022-01-03: 2 mg via INTRAVENOUS

## 2022-01-03 MED ORDER — FENTANYL CITRATE (PF) 100 MCG/2ML IJ SOLN
INTRAMUSCULAR | Status: DC | PRN
  Administered 2022-01-03 (×2): 25 ug via INTRAVENOUS

## 2022-01-03 NOTE — Transfer of Care (Signed)
Immediate Anesthesia Transfer of Care Note  Patient: Connor Byrd  Procedure(s) Performed: COLONOSCOPY WITH PROPOFOL  Patient Location: PACU and Endoscopy Unit  Anesthesia Type:MAC  Level of Consciousness: drowsy  Airway & Oxygen Therapy: Patient Spontanous Breathing  Post-op Assessment: Report given to RN and Post -op Vital signs reviewed and stable  Post vital signs: Reviewed  Last Vitals:  Vitals Value Taken Time  BP 125/90 01/03/22 0910  Temp 35.6 C 01/03/22 0909  Pulse 89 01/03/22 0911  Resp 22 01/03/22 0911  SpO2 95 % 01/03/22 0911  Vitals shown include unvalidated device data.  Last Pain:  Vitals:   01/03/22 0909  TempSrc: Temporal  PainSc:          Complications: No notable events documented.

## 2022-01-03 NOTE — Anesthesia Postprocedure Evaluation (Signed)
Anesthesia Post Note  Patient: Connor Byrd  Procedure(s) Performed: COLONOSCOPY WITH PROPOFOL  Patient location during evaluation: PACU Anesthesia Type: General Level of consciousness: awake and oriented Pain management: pain level controlled Vital Signs Assessment: post-procedure vital signs reviewed and stable Respiratory status: spontaneous breathing and respiratory function stable Cardiovascular status: stable Anesthetic complications: no   No notable events documented.   Last Vitals:  Vitals:   01/03/22 0909 01/03/22 0919  BP: 125/90 (!) 140/92  Pulse:    Resp:    Temp: (!) 35.6 C   SpO2:      Last Pain:  Vitals:   01/03/22 0919  TempSrc:   PainSc: 0-No pain                 VAN STAVEREN,Eriana Suliman

## 2022-01-03 NOTE — Anesthesia Preprocedure Evaluation (Signed)
Anesthesia Evaluation  Patient identified by MRN, date of birth, ID band Patient awake    Reviewed: Allergy & Precautions, H&P , NPO status , Patient's Chart, lab work & pertinent test results, reviewed documented beta blocker date and time   History of Anesthesia Complications Negative for: history of anesthetic complications  Airway Mallampati: II  TM Distance: >3 FB Neck ROM: full    Dental  (+) Dental Advidsory Given, Caps, Teeth Intact, Missing   Pulmonary neg pulmonary ROS, former smoker,    Pulmonary exam normal breath sounds clear to auscultation       Cardiovascular Exercise Tolerance: Good negative cardio ROS Normal cardiovascular exam Rhythm:regular Rate:Normal     Neuro/Psych PSYCHIATRIC DISORDERS Anxiety Depression negative neurological ROS     GI/Hepatic negative GI ROS, Neg liver ROS,   Endo/Other  negative endocrine ROS  Renal/GU negative Renal ROS  negative genitourinary   Musculoskeletal   Abdominal   Peds  Hematology negative hematology ROS (+)   Anesthesia Other Findings Past Medical History: No date: Depression No date: Insomnia   Reproductive/Obstetrics negative OB ROS                             Anesthesia Physical Anesthesia Plan  ASA: 2  Anesthesia Plan: General   Post-op Pain Management:    Induction: Intravenous  PONV Risk Score and Plan: 2 and Propofol infusion and TIVA  Airway Management Planned: Natural Airway and Nasal Cannula  Additional Equipment:   Intra-op Plan:   Post-operative Plan:   Informed Consent: I have reviewed the patients History and Physical, chart, labs and discussed the procedure including the risks, benefits and alternatives for the proposed anesthesia with the patient or authorized representative who has indicated his/her understanding and acceptance.     Dental Advisory Given  Plan Discussed with: Anesthesiologist,  CRNA and Surgeon  Anesthesia Plan Comments:         Anesthesia Quick Evaluation

## 2022-01-03 NOTE — H&P (Signed)
Cephas Darby, MD [address]  [place]  [place], [place] [address]  Main: [phone]  Fax: [phone] Pager: [phone]  Primary Care Physician:  Leone Haven, MD Primary Gastroenterologist:  Dr. Cephas Darby  Pre-Procedure History & Physical: HPI:  Connor Byrd is a 51 y.o. male is here for an colonoscopy.   Past Medical History:  Diagnosis Date   Depression    Insomnia     Past Surgical History:  Procedure Laterality Date   NO PAST SURGERIES     WISDOM TOOTH EXTRACTION      Prior to Admission medications   Medication Sig Start Date End Date Taking? Authorizing Provider  escitalopram (LEXAPRO) 20 MG tablet TAKE 1 AND 1/2 TABLET BY MOUTH DAILY 10/03/21  Yes Leone Haven, MD  hydrOXYzine (ATARAX) 10 MG tablet TAKE ONE TABLET BY MOUTH THREE TIMES A DAY AS NEEDED FOR ANXIETY 12/05/21  Yes Leone Haven, MD  traZODone (DESYREL) 50 MG tablet TAKE ONE TO TWO TABLETS BY MOUTH EVERY NIGHT AT BEDTIME AS NEEDED FOR SLEEP 08/31/20  Yes Leone Haven, MD  tadalafil (CIALIS) 5 MG tablet Take 0.5 tablets (2.5 mg total) by mouth daily as needed for erectile dysfunction. 08/26/21   Leone Haven, MD    Allergies as of 12/04/2021   (No Known Allergies)    Family History  Problem Relation Age of Onset   Alcohol abuse Mother    Arthritis Mother    Mental illness Mother    Hyperlipidemia Father    Hypertension Father    Diabetes Father    Mental illness Maternal Grandmother    Diabetes Maternal Grandfather    Alcohol abuse Paternal Grandfather    Prostate cancer Neg Hx    Bladder Cancer Neg Hx    Kidney cancer Neg Hx     Social History   Socioeconomic History   Marital status: Widowed    Spouse name: Not on file   Number of children: Not on file   Years of education: Not on file   Highest education level: Not on file  Occupational History   Not on file  Tobacco Use   Smoking status: Former    Types: Cigarettes    Quit date:  01/03/2016    Years since quitting: 6.0   Smokeless tobacco: Never  Vaping Use   Vaping Use: Never used  Substance and Sexual Activity   Alcohol use: Yes    Alcohol/week: 2.0 - 4.0 standard drinks of alcohol    Types: 2 - 4 Standard drinks or equivalent per week   Drug use: Yes    Types: Marijuana    Comment: only once last 12/29/2021   Sexual activity: Yes    Partners: Female  Other Topics Concern   Not on file  Social History Narrative   Not on file   Social Determinants of Health   Financial Resource Strain: Not on file  Food Insecurity: Not on file  Transportation Needs: Not on file  Physical Activity: Not on file  Stress: Not on file  Social Connections: Not on file  Intimate Partner Violence: Not on file    Review of Systems: See HPI, otherwise negative ROS  Physical Exam: BP (!) 147/100   Pulse 73   Temp (!) 96.8 F (36 C) (Temporal)   Resp 19   Ht 6' (1.829 m)   Wt 108.9 kg   SpO2 98%   BMI 32.55 kg/m  General:   Alert,  pleasant and cooperative in NAD Head:  Normocephalic and atraumatic. Neck:  Supple; no masses or thyromegaly. Lungs:  Clear throughout to auscultation.    Heart:  Regular rate and rhythm. Abdomen:  Soft, nontender and nondistended. Normal bowel sounds, without guarding, and without rebound.   Neurologic:  Alert and  oriented x4;  grossly normal neurologically.  Impression/Plan: Connor Byrd is here for an colonoscopy to be performed for colon  cancer screening  Risks, benefits, limitations, and alternatives regarding  colonoscopy have been reviewed with the patient.  Questions have been answered.  All parties agreeable.   Sherri Sear, MD  01/03/2022, 7:44 AM

## 2022-01-03 NOTE — Op Note (Signed)
[facility] Gastroenterology Patient Name: Connor Byrd Procedure Date: 01/03/2022 8:27 AM MRN: [id number] Account #: [id number] Date of Birth: 06-Dec-1970 Admit Type: Outpatient Age: 51 Room: [facility] ENDO ROOM 1 Gender: Male Note Status: Finalized Instrument Name: Park Meo [id number] Procedure:             Colonoscopy Indications:           Screening for colorectal malignant neoplasm, This is                         the patient's first colonoscopy Providers:             Lin Landsman MD, MD Medicines:             General Anesthesia Complications:         No immediate complications. Estimated blood loss: None. Procedure:             Pre-Anesthesia Assessment:                        - Prior to the procedure, a History and Physical was                         performed, and patient medications and allergies were                         reviewed. The patient is competent. The risks and                         benefits of the procedure and the sedation options and                         risks were discussed with the patient. All questions                         were answered and informed consent was obtained.                         Patient identification and proposed procedure were                         verified by the physician, the nurse, the                         anesthesiologist, the anesthetist and the technician                         in the pre-procedure area in the procedure room in the                         endoscopy suite. Mental Status Examination: alert and                         oriented. Airway Examination: normal oropharyngeal                         airway and neck mobility. Respiratory Examination:                         clear to auscultation.  CV Examination: normal.                         Prophylactic Antibiotics: The patient does not require                         prophylactic antibiotics. Prior Anticoagulants: The                          patient has taken no previous anticoagulant or                         antiplatelet agents. ASA Grade Assessment: II - A                         patient with mild systemic disease. After reviewing                         the risks and benefits, the patient was deemed in                         satisfactory condition to undergo the procedure. The                         anesthesia plan was to use general anesthesia.                         Immediately prior to administration of medications,                         the patient was re-assessed for adequacy to receive                         sedatives. The heart rate, respiratory rate, oxygen                         saturations, blood pressure, adequacy of pulmonary                         ventilation, and response to care were monitored                         throughout the procedure. The physical status of the                         patient was re-assessed after the procedure.                        After obtaining informed consent, the colonoscope was                         passed under direct vision. Throughout the procedure,                         the patient's blood pressure, pulse, and oxygen                         saturations were monitored continuously. The  Colonoscope was introduced through the anus and                         advanced to the the terminal ileum, with                         identification of the appendiceal orifice and IC                         valve. The colonoscopy was performed without                         difficulty. The patient tolerated the procedure well.                         The quality of the bowel preparation was evaluated                         using the BBPS [facility] Bowel Preparation Scale) with                         scores of: Right Colon = 3, Transverse Colon = 3 and                         Left Colon = 3 (entire mucosa seen well with no                          residual staining, small fragments of stool or opaque                         liquid). The total BBPS score equals 9. Findings:      Skin tags were found on perianal exam.      The terminal ileum appeared normal.      Four sessile polyps were found in the descending colon and transverse       colon. The polyps were 4 to 6 mm in size. These polyps were removed with       a cold snare. Resection and retrieval were complete. Estimated blood       loss: none.      The retroflexed view of the distal rectum and anal verge was normal and       showed no anal or rectal abnormalities. Impression:            - Perianal skin tags found on perianal exam.                        - The examined portion of the ileum was normal.                        - Four 4 to 6 mm polyps in the descending colon and in                         the transverse colon, removed with a cold snare.                         Resected and retrieved.                        -  The distal rectum and anal verge are normal on                         retroflexion view. Recommendation:        - Discharge patient to home (with escort).                        - Resume previous diet today.                        - Continue present medications.                        - Await pathology results.                        - Repeat colonoscopy in 3 - 5 years for surveillance                         based on pathology results. Procedure Code(s):     --- Professional ---                        [phone], Colonoscopy, flexible; with removal of                         tumor(s), polyp(s), or other lesion(s) by snare                         technique Diagnosis Code(s):     --- Professional ---                        Z12.11, Encounter for screening for malignant neoplasm                         of colon                        K63.5, Polyp of colon                        K64.4, Residual hemorrhoidal skin tags CPT copyright 2019 American Medical  Association. All rights reserved. The codes documented in this report are preliminary and upon coder review may  be revised to meet current compliance requirements. Dr. Ulyess Mort Lin Landsman MD, MD 01/03/2022 9:06:37 AM This report has been signed electronically. Number of Addenda: 0 Note Initiated On: 01/03/2022 8:27 AM Scope Withdrawal Time: 0 hours 18 minutes 2 seconds  Total Procedure Duration: 0 hours 21 minutes 31 seconds  Estimated Blood Loss:  Estimated blood loss: none.      [facility]

## 2022-01-06 ENCOUNTER — Encounter: Payer: Self-pay | Admitting: Gastroenterology

## 2022-01-06 LAB — SURGICAL PATHOLOGY

## 2022-01-07 ENCOUNTER — Telehealth: Payer: Self-pay | Admitting: Family Medicine

## 2022-01-07 ENCOUNTER — Telehealth: Payer: Self-pay

## 2022-01-07 NOTE — Telephone Encounter (Signed)
Sleep study order was faxed to [facility] sleep med.

## 2022-01-07 NOTE — Telephone Encounter (Signed)
Message sent to [place] about this.  Wyvonne Carda,cma

## 2022-01-07 NOTE — Telephone Encounter (Signed)
Patient states he has not heard from anyone regarding a sleep study yet.

## 2022-02-04 NOTE — Telephone Encounter (Signed)
Patient called to state he has not heard from anyone regarding his sleep study so he is following-up with [place].

## 2022-02-05 NOTE — Telephone Encounter (Signed)
Can you call the patient and see if he has not heard about the results or did he not have the sleep study done yet? Thanks.

## 2022-02-05 NOTE — Telephone Encounter (Signed)
LVM for patient to call back.   Mayrene Bastarache,cma  

## 2022-02-06 NOTE — Telephone Encounter (Signed)
I called the patient and he stated he has not been called to schedule the sleep study yet, I sent a message to [place] bout that today.  Connor Byrd,cma

## 2022-02-06 NOTE — Telephone Encounter (Signed)
Noted. Connor Byrd, can you follow-up on this for the patient? Thanks.

## 2022-02-10 ENCOUNTER — Other Ambulatory Visit: Payer: Self-pay | Admitting: Family Medicine

## 2022-02-10 DIAGNOSIS — N529 Male erectile dysfunction, unspecified: Secondary | ICD-10-CM

## 2022-02-18 NOTE — Telephone Encounter (Signed)
Pt called wanting to check on his referral for [facility]. Pt was given the number to call them.

## 2022-03-05 ENCOUNTER — Encounter: Payer: Self-pay | Admitting: Family Medicine

## 2022-03-05 ENCOUNTER — Telehealth: Payer: Self-pay | Admitting: Family Medicine

## 2022-03-05 ENCOUNTER — Ambulatory Visit: Payer: BC Managed Care – PPO | Admitting: Family Medicine

## 2022-03-05 VITALS — BP 140/80 | HR 76 | Temp 98.7°F | Ht 72.0 in | Wt 261.6 lb

## 2022-03-05 DIAGNOSIS — R030 Elevated blood-pressure reading, without diagnosis of hypertension: Secondary | ICD-10-CM

## 2022-03-05 DIAGNOSIS — N529 Male erectile dysfunction, unspecified: Secondary | ICD-10-CM

## 2022-03-05 DIAGNOSIS — F419 Anxiety disorder, unspecified: Secondary | ICD-10-CM | POA: Diagnosis not present

## 2022-03-05 DIAGNOSIS — F32A Anxiety disorder, unspecified: Secondary | ICD-10-CM

## 2022-03-05 DIAGNOSIS — Z23 Encounter for immunization: Secondary | ICD-10-CM | POA: Diagnosis not present

## 2022-03-05 DIAGNOSIS — R0681 Apnea, not elsewhere classified: Secondary | ICD-10-CM

## 2022-03-05 MED ORDER — BUSPIRONE HCL 7.5 MG PO TABS: 7.5000 mg | ORAL_TABLET | Freq: Two times a day (BID) | ORAL | 3 refills | Status: DC

## 2022-03-05 NOTE — Telephone Encounter (Signed)
LVM for patient to call back.   Kaloni Bisaillon,cma  

## 2022-03-05 NOTE — Telephone Encounter (Signed)
Please let the patient know that our referral coordinator looked into options for the sleep study.  It seems like the best option is to have him see pulmonology to evaluate and help get his sleep study set up.  I have placed a referral and they should contact him to get this appointment scheduled.

## 2022-03-05 NOTE — Assessment & Plan Note (Signed)
He can continue Cialis 2.5 mg daily as needed for erectile dysfunction.

## 2022-03-05 NOTE — Progress Notes (Signed)
fol

## 2022-03-05 NOTE — Assessment & Plan Note (Signed)
Patient has not been able to get a sleep study set up.  I believe the patient and our office have tried multiple times to contact the sleep center.  We will place an order to have this completed at a different sleep center.

## 2022-03-05 NOTE — Assessment & Plan Note (Signed)
Depression is worsened.  He will continue Lexapro 20 mg daily.  We will add buspirone 7.5 mg twice daily.  He can continue hydroxyzine 10 mg 3 times daily as needed for anxiety.  He will let me know if he develops any side effects with the buspirone.  He will continue therapy.

## 2022-03-05 NOTE — Progress Notes (Signed)
Connor Rumps, MD Phone: [phone]  Connor Byrd is a 51 y.o. male who presents today for f/u.  Anxiety/depression: Patient notes his depression is worsened over the last month or so.  He has had some financial hardship and one of his friends passed away.  He notes it was the friend that help pay for his wife's medical expenses when she was in the process of passing away.  He remains on Lexapro.  He takes hydroxyzine periodically to help with panic attacks.  He notes no SI or HI.  He has been doing therapy for this as well.  He notes in the past he was on Wellbutrin and that caused weight gain.  Erectile dysfunction: Patient notes Cialis works well.  Elevated blood pressure: Patient notes no chest pain or shortness of breath.  Apneic episodes: He notes he has not heard about the sleep study.  Social History   Tobacco Use  Smoking Status Former   Types: Cigarettes   Quit date: 01/03/2016   Years since quitting: 6.1  Smokeless Tobacco Never    Current Outpatient Medications on File Prior to Visit  Medication Sig Dispense Refill   escitalopram (LEXAPRO) 20 MG tablet TAKE 1 AND 1/2 TABLET BY MOUTH DAILY 135 tablet 1   hydrOXYzine (ATARAX) 10 MG tablet TAKE ONE TABLET BY MOUTH THREE TIMES A DAY AS NEEDED FOR ANXIETY 30 tablet 0   tadalafil (CIALIS) 5 MG tablet TAKE 1/2 TABLET BY MOUTH DAILY AS NEEDED FOR ERECTILE DYSFUNCTION 45 tablet 1   traZODone (DESYREL) 50 MG tablet TAKE ONE TO TWO TABLETS BY MOUTH EVERY NIGHT AT BEDTIME AS NEEDED FOR SLEEP 90 tablet 1   No current facility-administered medications on file prior to visit.     ROS see history of present illness  Objective  Physical Exam Vitals:   03/05/22 1455 03/05/22 1502  BP: (!) 160/80 (!) 140/80  Pulse: 76   Temp: 98.7 F (37.1 C)   SpO2: 98%     BP Readings from Last 3 Encounters:  03/05/22 (!) 140/80  01/03/22 (!) 140/92  11/29/21 118/80   Wt Readings from Last 3 Encounters:  03/05/22 261 lb 9.6  oz (118.7 kg)  01/03/22 240 lb (108.9 kg)  11/29/21 240 lb 9.6 oz (109.1 kg)    Physical Exam Constitutional:      General: He is not in acute distress.    Appearance: He is not diaphoretic.  Cardiovascular:     Rate and Rhythm: Normal rate and regular rhythm.     Heart sounds: Normal heart sounds.  Pulmonary:     Effort: Pulmonary effort is normal.     Breath sounds: Normal breath sounds.  Skin:    General: Skin is warm and dry.  Neurological:     Mental Status: He is alert.      Assessment/Plan: Please see individual problem list.  Problem List Items Addressed This Visit     Anxiety and depression - Primary (Chronic)    Depression is worsened.  He will continue Lexapro 20 mg daily.  We will add buspirone 7.5 mg twice daily.  He can continue hydroxyzine 10 mg 3 times daily as needed for anxiety.  He will let me know if he develops any side effects with the buspirone.  He will continue therapy.      Relevant Medications   busPIRone (BUSPAR) 7.5 MG tablet   Erectile dysfunction (Chronic)    He can continue Cialis 2.5 mg daily as needed for erectile dysfunction.  Elevated blood pressure reading    Possibly related to stress in his life.  He may also have untreated sleep apnea and we are working on getting that evaluated.  He did improve somewhat on recheck.  We will have him return for nurse BP check in 2 weeks.  He will start checking his blood pressure at home when he has been at rest for 10 to 15 minutes at a time.      Witnessed apneic spells    Patient has not been able to get a sleep study set up.  I believe the patient and our office have tried multiple times to contact the sleep center.  We will place an order to have this completed at a different sleep center.      Relevant Orders   Home sleep test   Other Visit Diagnoses     Need for immunization against influenza       Relevant Orders   Flu Vaccine QUAD 45moIM (Fluarix, Fluzone & Alfiuria Quad PF)  (Completed)       Return in about 2 weeks (around 03/19/2022) for Nurse blood pressure check, 6 weeks PCP for depression.   ETommi Rumps MD [place]

## 2022-03-05 NOTE — Assessment & Plan Note (Signed)
Possibly related to stress in his life.  He may also have untreated sleep apnea and we are working on getting that evaluated.  He did improve somewhat on recheck.  We will have him return for nurse BP check in 2 weeks.  He will start checking his blood pressure at home when he has been at rest for 10 to 15 minutes at a time.

## 2022-03-05 NOTE — Patient Instructions (Signed)
Nice to see you. We are going to add buspirone 7.5 mg twice daily.  If you notice any side effects with this please let us know. Please start checking your blood pressure at home when you have been at rest for 10 to 15 minutes.  We will have you return in 2 weeks for a nurse blood pressure check. If you do not hear from somebody on the sleep study within the next week or 2 please let us know.

## 2022-03-06 NOTE — Telephone Encounter (Signed)
I called and LVM informing the patient to call back to schedule a nurse visit in 2 weeks to check his BP and to schedule a follow up with the provider in 6 weeks for a BP follow up. I also sent a message in mychart to the patient about his referral and to call back to schedule.  Monti Villers,cma

## 2022-03-12 ENCOUNTER — Ambulatory Visit (INDEPENDENT_AMBULATORY_CARE_PROVIDER_SITE_OTHER): Payer: BC Managed Care – PPO | Admitting: Nurse Practitioner

## 2022-03-12 ENCOUNTER — Encounter: Payer: Self-pay | Admitting: Nurse Practitioner

## 2022-03-12 VITALS — BP 130/82 | HR 88 | Ht 72.0 in | Wt 260.0 lb

## 2022-03-12 DIAGNOSIS — R0683 Snoring: Secondary | ICD-10-CM | POA: Diagnosis not present

## 2022-03-12 DIAGNOSIS — E669 Obesity, unspecified: Secondary | ICD-10-CM

## 2022-03-12 NOTE — Progress Notes (Signed)
$'@Patient't$  ID: Roxanna Mew, male    DOB: 01-24-1971, 51 y.o.   MRN: [id number]  Chief Complaint  Patient presents with   Consult    Pt sleep consult, snoring, witness apneas, restless legs. No sleep study, no CPAP use.     Referring provider: Leone Haven, MD  HPI: 51 year old male, former smoker referred for sleep consult.  Past medical history significant for GERD, obesity, insomnia, anxiety/depression.  TEST/EVENTS:   03/12/2022: Today - follow up Patient presents today for sleep consult referred by his PCP, Dr. Caryl Bis.  He has struggled with insomnia for quite some time now; has difficulties with both falling asleep and staying asleep.  He is currently on trazodone.  He takes anywhere from 50 mg to 100 mg at night.  He has been told his whole life that he snores.  He recently was told by his girlfriend that she noticed that he stops breathing when he sleeps and will gasp before he starts breathing again.  He also has struggled with some restless leg at night.  Wakes in the morning feeling poorly rested with dry mouth.  Fatigued during the day. Denies any morning headache, drowsy driving, sleep parasomnia/paralysis, symptoms of narcolepsy or cataplexy. He goes to bed around 11 PM.  Typically falls asleep within 20 minutes using trazodone.  Wakes 3-4 times a night.  Officially gets out of bed in the morning around 7 AM.  He drinks a couple coffee in the morning and occasionally has a Diet Coke at lunch but no other significant caffeine intake.  His weight is down 40 pounds over the last 2 years.  He has never had a sleep study before. He has a history of high blood pressure, well controlled on antihypertensives.  No history of stroke.  Past family history significant for grandfather with emphysema and mother with cancer.  He is a former smoker.  Drinks alcohol socially; usually 2-3 drinks on the weekends.  He also occasionally smokes marijuana.  No other illicit drug use. He is  widowed.  Lives at home with his son.  Works in [employer].  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  Epworth 7  No Known Allergies  Immunization History  Administered Date(s) Administered   Influenza, Quadrivalent, Recombinant, Inj, Pf 04/02/2019   Influenza,inj,Quad PF,6+ Mos 05/27/2017, 05/28/2018, 03/05/2022   Influenza-Unspecified 05/24/2021   Moderna Covid-19 Vaccine Bivalent Booster 36yr & up 05/24/2021   Moderna Sars-Covid-2 Vaccination 08/25/2019, 09/27/2019   Tdap 05/27/2017    Past Medical History:  Diagnosis Date   Depression    Insomnia     Tobacco History: Social History   Tobacco Use  Smoking Status Former   Packs/day: 1.50   Types: Cigarettes   Start date: 03/10/1987   Quit date: 01/03/2016   Years since quitting: 6.1  Smokeless Tobacco Never   Counseling given: Not Answered   Outpatient Medications Prior to Visit  Medication Sig Dispense Refill   busPIRone (BUSPAR) 7.5 MG tablet Take 1 tablet (7.5 mg total) by mouth 2 (two) times daily. 60 tablet 3   escitalopram (LEXAPRO) 20 MG tablet TAKE 1 AND 1/2 TABLET BY MOUTH DAILY 135 tablet 1   hydrOXYzine (ATARAX) 10 MG tablet TAKE ONE TABLET BY MOUTH THREE TIMES A DAY AS NEEDED FOR ANXIETY 30 tablet 0   tadalafil (CIALIS) 5 MG tablet TAKE 1/2 TABLET BY MOUTH DAILY AS NEEDED FOR ERECTILE DYSFUNCTION 45 tablet 1   traZODone (DESYREL) 50 MG tablet TAKE ONE  TO TWO TABLETS BY MOUTH EVERY NIGHT AT BEDTIME AS NEEDED FOR SLEEP 90 tablet 1   No facility-administered medications prior to visit.     Review of Systems:   Constitutional: No weight loss or gain, night sweats, fevers, chills, or lassitude. +excessive daytime fatigue  HEENT: No headaches, difficulty swallowing, tooth/dental problems, or sore throat. No sneezing, itching, ear ache, nasal congestion, or post nasal drip CV:  No chest pain, orthopnea, PND, swelling in lower extremities, anasarca, dizziness, palpitations, syncope Resp: +snoring,  witnessed nocturnal apneas. No shortness of breath with exertion or at rest. No excess mucus or change in color of mucus. No productive or non-productive. No hemoptysis. No wheezing.  No chest wall deformity GI:  No heartburn, indigestion, abdominal pain, nausea, vomiting, diarrhea, change in bowel habits, loss of appetite, bloody stools.  GU: No dysuria, change in color of urine, urgency or frequency.  No flank pain, no hematuria  Skin: No rash, lesions, ulcerations MSK:  No joint pain or swelling.  No decreased range of motion.  No back pain. Neuro: No dizziness or lightheadedness.  Psych: Mood stable. No SI/HI. +sleep disturbance    Physical Exam:  BP 130/82   Pulse 88   Ht 6' (1.829 m)   Wt 260 lb (117.9 kg)   SpO2 97%   BMI 35.26 kg/m   GEN: Pleasant, interactive, well-appearing; obese; in no acute distress. HEENT:  Normocephalic and atraumatic. PERRLA. Sclera white. Nasal turbinates pink, moist and patent bilaterally. No rhinorrhea present. Oropharynx pink and moist, without exudate or edema. No lesions, ulcerations, or postnasal drip. Mallampati III NECK:  Supple w/ fair ROM. No JVD present. Normal carotid impulses w/o bruits. Thyroid symmetrical with no goiter or nodules palpated. No lymphadenopathy.   CV: RRR, no m/r/g, no peripheral edema. Pulses intact, +2 bilaterally. No cyanosis, pallor or clubbing. PULMONARY:  Unlabored, regular breathing. Clear bilaterally A&P w/o wheezes/rales/rhonchi. No accessory muscle use.  GI: BS present and normoactive. Soft, non-tender to palpation. No organomegaly or masses detected.  MSK: No erythema, warmth or tenderness. No deformities or joint swelling noted.  Neuro: A/Ox3. No focal deficits noted.   Skin: Warm, no lesions or rashe Psych: Normal affect and behavior. Judgement and thought content appropriate.     Lab Results:  CBC    Component Value Date/Time   WBC 5.9 04/16/2021 0959   RBC 5.50 04/16/2021 0959   HGB 17.1 (H)  04/16/2021 0959   HCT 51.3 04/16/2021 0959   PLT 219.0 04/16/2021 0959   MCV 93.2 04/16/2021 0959   MCH 31.1 05/15/2020 2351   MCHC 33.3 04/16/2021 0959   RDW 12.6 04/16/2021 0959   LYMPHSABS 1.7 05/15/2020 2351   MONOABS 0.8 05/15/2020 2351   EOSABS 0.1 05/15/2020 2351   BASOSABS 0.0 05/15/2020 2351    BMET    Component Value Date/Time   NA 140 03/05/2021 0927   K 4.0 03/05/2021 0927   CL 104 03/05/2021 0927   CO2 26 03/05/2021 0927   GLUCOSE 101 (H) 03/05/2021 0927   BUN 8 03/05/2021 0927   CREATININE 0.71 03/05/2021 0927   CALCIUM 9.4 03/05/2021 0927   GFRNONAA >60 05/15/2020 2351    BNP No results found for: "BNP"   Imaging:  No results found.        No data to display          No results found for: "NITRICOXIDE"      Assessment & Plan:   Loud snoring He has snoring, excessive daytime  sleepiness, nocturnal apneic events, RLS, disrupted sleep. BMI 35. Epworth 7.  Given this,  I am concerned he could have sleep disordered breathing with obstructive sleep apnea. He will need sleep study for further evaluation.    - discussed how weight can impact sleep and risk for sleep disordered breathing - discussed options to assist with weight loss: combination of diet modification, cardiovascular and strength training exercises   - had an extensive discussion regarding the adverse health consequences related to untreated sleep disordered breathing - specifically discussed the risks for hypertension, coronary artery disease, cardiac dysrhythmias, cerebrovascular disease, and diabetes - lifestyle modification discussed   - discussed how sleep disruption can increase risk of accidents, particularly when driving - safe driving practices were discussed  Patient Instructions  Given your symptoms, I am concerned that you could have sleep disordered breathing with obstructive sleep apnea. I have ordered a home sleep study for further evaluation. Someone will contact  you for scheduling.   We discussed how untreated sleep apnea puts an individual at risk for cardiac arrhthymias, pulm HTN, DM, stroke and increases their risk for daytime accidents. We also briefly reviewed treatment options including weight loss, side sleeping position, oral appliance, CPAP therapy or referral to ENT for possible surgical options. Be aware of and avoid drowsy driving  Follow up in 8-10 weeks with Joellen Jersey Gal Smolinski,NP or sooner if needed     Obesity (BMI 30-39.9) BMI 35. Healthy weight loss encouraged.    I spent 35 minutes of dedicated to the care of this patient on the date of this encounter to include pre-visit review of records, face-to-face time with the patient discussing conditions above, post visit ordering of testing, clinical documentation with the electronic health record, making appropriate referrals as documented, and communicating necessary findings to members of the patients care team.  Clayton Bibles, NP 03/12/2022  Pt aware and understands NP's role.

## 2022-03-12 NOTE — Assessment & Plan Note (Signed)
BMI 35. Healthy weight loss encouraged.  

## 2022-03-12 NOTE — Patient Instructions (Addendum)
Given your symptoms, I am concerned that you could have sleep disordered breathing with obstructive sleep apnea. I have ordered a home sleep study for further evaluation. Someone will contact you for scheduling.   We discussed how untreated sleep apnea puts an individual at risk for cardiac arrhthymias, pulm HTN, DM, stroke and increases their risk for daytime accidents. We also briefly reviewed treatment options including weight loss, side sleeping position, oral appliance, CPAP therapy or referral to ENT for possible surgical options. Be aware of and avoid drowsy driving  Follow up in 8-10 weeks with Joellen Jersey Julina Altmann,NP or sooner if needed

## 2022-03-12 NOTE — Assessment & Plan Note (Signed)
He has snoring, excessive daytime sleepiness, nocturnal apneic events, RLS, disrupted sleep. BMI 35. Epworth 7.  Given this,  I am concerned he could have sleep disordered breathing with obstructive sleep apnea. He will need sleep study for further evaluation.    - discussed how weight can impact sleep and risk for sleep disordered breathing - discussed options to assist with weight loss: combination of diet modification, cardiovascular and strength training exercises   - had an extensive discussion regarding the adverse health consequences related to untreated sleep disordered breathing - specifically discussed the risks for hypertension, coronary artery disease, cardiac dysrhythmias, cerebrovascular disease, and diabetes - lifestyle modification discussed   - discussed how sleep disruption can increase risk of accidents, particularly when driving - safe driving practices were discussed  Patient Instructions  Given your symptoms, I am concerned that you could have sleep disordered breathing with obstructive sleep apnea. I have ordered a home sleep study for further evaluation. Someone will contact you for scheduling.   We discussed how untreated sleep apnea puts an individual at risk for cardiac arrhthymias, pulm HTN, DM, stroke and increases their risk for daytime accidents. We also briefly reviewed treatment options including weight loss, side sleeping position, oral appliance, CPAP therapy or referral to ENT for possible surgical options. Be aware of and avoid drowsy driving  Follow up in 8-10 weeks with Joellen Jersey Shuayb Schepers,NP or sooner if needed

## 2022-03-13 NOTE — Progress Notes (Signed)
Reviewed and agree with assessment/plan.   Chesley Mires, MD [facility] Pulmonary/Critical Care 03/13/2022, 8:27 AM Pager:  [phone]

## 2022-03-19 ENCOUNTER — Encounter: Payer: Self-pay | Admitting: Family Medicine

## 2022-03-21 ENCOUNTER — Ambulatory Visit: Payer: BC Managed Care – PPO

## 2022-03-25 ENCOUNTER — Ambulatory Visit (INDEPENDENT_AMBULATORY_CARE_PROVIDER_SITE_OTHER): Payer: BC Managed Care – PPO

## 2022-03-25 VITALS — BP 130/90 | HR 78

## 2022-03-25 DIAGNOSIS — R030 Elevated blood-pressure reading, without diagnosis of hypertension: Secondary | ICD-10-CM

## 2022-03-25 DIAGNOSIS — I10 Essential (primary) hypertension: Secondary | ICD-10-CM

## 2022-03-25 NOTE — Progress Notes (Signed)
Patient here for nurse visit BP check per order from OV 03/05/22.   Patient reports compliance with prescribed BP medications: no, pt is not on any hypertensive medication.   Last dose of BP medication: Does not apply (N/A)  BP Readings from Last 3 Encounters:  03/25/22 (!) 130/90  03/12/22 130/82  03/05/22 (!) 140/80   Pulse Readings from Last 3 Encounters:  03/25/22 78  03/12/22 88  03/05/22 76  Pt's first initial reading: L arm:140/100 PR:77 O2:98  I instructed pt to sit & relax for about 10-15 mins before rechecking BP once more. 2nd readings as above.  Notified pt that note would be sent to PCP for review & CMA would be reaching out to pt regarding next steps & or change in therapy.   Patient verbalized understanding of instructions.   *Pt did state he will be sending BP readings via mychart from the past couple wks*  Elita Quick Dimas,CMA

## 2022-03-26 ENCOUNTER — Telehealth: Payer: Self-pay

## 2022-03-26 NOTE — Progress Notes (Addendum)
Lvm for pt to return call in regards to his BP check that was done yesterday 03/25/22. Sent mychart msg, see note.

## 2022-03-26 NOTE — Telephone Encounter (Signed)
Lvm for pt to return call in regards to BP check that was done yesterday 03/25/22  Per Dr.Sonnenberg: BP is above goal. Can you follow-up with him to see what his BPs have been at home? We may need to start medication depending on what his home BPs have been.

## 2022-03-27 NOTE — Progress Notes (Signed)
Pt replied to [place] sent on 03/26/22.   Per pt: Thanks Elita Quick, I am out of town the rest of the week for work but will get to this over the weekend.    Thanks,  Ronalee Belts

## 2022-03-27 NOTE — Telephone Encounter (Signed)
Called pt x2 lvm x2 for pt to return call in regards to his BP check that was done on 03/25/22. Sent mychart msg 03/26/22, see note.

## 2022-03-27 NOTE — Progress Notes (Signed)
Called pt x2 lvm x2 for pt to return call in regards to his BP check that was done on 03/25/22. Sent mychart msg 03/26/22, see note.

## 2022-03-28 ENCOUNTER — Telehealth: Payer: Self-pay | Admitting: Family Medicine

## 2022-03-28 NOTE — Telephone Encounter (Signed)
Please follow-up with the patient on Monday to see what his home BPs have been. Thanks.

## 2022-03-31 NOTE — Telephone Encounter (Signed)
I called and LVM for the patient to call back and give BP readings from last week or send them through mychart.  Connor Byrd,cma

## 2022-04-02 ENCOUNTER — Encounter: Payer: Self-pay | Admitting: Family Medicine

## 2022-04-03 NOTE — Telephone Encounter (Signed)
Patient sent BP reading on mychart and I printed them and put them in the lab basket.  Roshad Hack,cma

## 2022-04-11 ENCOUNTER — Other Ambulatory Visit: Payer: Self-pay | Admitting: Family Medicine

## 2022-04-11 DIAGNOSIS — F32A Depression, unspecified: Secondary | ICD-10-CM

## 2022-04-15 MED ORDER — VALSARTAN 80 MG PO TABS: 80.0000 mg | ORAL_TABLET | Freq: Every day | ORAL | 3 refills | Status: DC

## 2022-04-17 NOTE — Telephone Encounter (Signed)
LMTCB to try to get patient rescheduled. Lab appointment is needed next week. Appointment with provider can be later if no availability.

## 2022-04-21 ENCOUNTER — Telehealth: Payer: Self-pay | Admitting: *Deleted

## 2022-04-21 DIAGNOSIS — I10 Essential (primary) hypertension: Secondary | ICD-10-CM

## 2022-04-21 NOTE — Telephone Encounter (Signed)
Please place future lab orders. Pt has lab appt on 04/23/22.  Thanks

## 2022-04-21 NOTE — Telephone Encounter (Signed)
Ordered

## 2022-04-22 ENCOUNTER — Ambulatory Visit: Payer: BC Managed Care – PPO | Admitting: Family Medicine

## 2022-04-23 ENCOUNTER — Other Ambulatory Visit (INDEPENDENT_AMBULATORY_CARE_PROVIDER_SITE_OTHER): Payer: BC Managed Care – PPO

## 2022-04-23 DIAGNOSIS — I10 Essential (primary) hypertension: Secondary | ICD-10-CM | POA: Diagnosis not present

## 2022-04-23 LAB — COMPREHENSIVE METABOLIC PANEL
ALT: 24 U/L (ref 0–53)
AST: 18 U/L (ref 0–37)
Albumin: 4.5 g/dL (ref 3.5–5.2)
Alkaline Phosphatase: 62 U/L (ref 39–117)
BUN: 14 mg/dL (ref 6–23)
CO2: 31 mEq/L (ref 19–32)
Calcium: 9.3 mg/dL (ref 8.4–10.5)
Chloride: 102 mEq/L (ref 96–112)
Creatinine, Ser: 0.76 mg/dL (ref 0.40–1.50)
GFR: 105.09 mL/min (ref >60.00)
Glucose, Bld: 97 mg/dL (ref 70–99)
Potassium: 3.9 mEq/L (ref 3.5–5.1)
Sodium: 141 mEq/L (ref 135–145)
Total Bilirubin: 0.6 mg/dL (ref 0.2–1.2)
Total Protein: 6.9 g/dL (ref 6.0–8.3)

## 2022-04-23 LAB — CBC
HCT: 46.6 % (ref 39.0–52.0)
Hemoglobin: 16.1 g/dL (ref 13.0–17.0)
MCHC: 34.5 g/dL (ref 30.0–36.0)
MCV: 88.9 fl (ref 78.0–100.0)
Platelets: 324 10*3/uL (ref 150.0–400.0)
RBC: 5.24 Mil/uL (ref 4.22–5.81)
RDW: 13.1 % (ref 11.5–15.5)
WBC: 8.9 10*3/uL (ref 4.0–10.5)

## 2022-04-23 LAB — LIPID PANEL
Cholesterol: 188 mg/dL (ref 0–200)
HDL: 33.1 mg/dL — ABNORMAL LOW (ref >39.00)
LDL Cholesterol: 134 mg/dL — ABNORMAL HIGH (ref 0–99)
NonHDL: 154.44
Total CHOL/HDL Ratio: 6
Triglycerides: 102 mg/dL (ref 0.0–149.0)
VLDL: 20.4 mg/dL (ref 0.0–40.0)

## 2022-04-23 LAB — HEMOGLOBIN A1C: Hgb A1c MFr Bld: 5.7 % (ref 4.6–6.5)

## 2022-04-28 ENCOUNTER — Telehealth: Payer: Self-pay | Admitting: Family Medicine

## 2022-04-28 ENCOUNTER — Ambulatory Visit: Payer: BC Managed Care – PPO | Admitting: Family Medicine

## 2022-04-28 VITALS — BP 118/72 | HR 72 | Temp 98.1°F | Ht 72.0 in | Wt 261.6 lb

## 2022-04-28 DIAGNOSIS — F419 Anxiety disorder, unspecified: Secondary | ICD-10-CM | POA: Diagnosis not present

## 2022-04-28 DIAGNOSIS — R7303 Prediabetes: Secondary | ICD-10-CM | POA: Insufficient documentation

## 2022-04-28 DIAGNOSIS — J989 Respiratory disorder, unspecified: Secondary | ICD-10-CM | POA: Diagnosis not present

## 2022-04-28 DIAGNOSIS — R7989 Other specified abnormal findings of blood chemistry: Secondary | ICD-10-CM | POA: Insufficient documentation

## 2022-04-28 DIAGNOSIS — E669 Obesity, unspecified: Secondary | ICD-10-CM | POA: Diagnosis not present

## 2022-04-28 DIAGNOSIS — F32A Depression, unspecified: Secondary | ICD-10-CM

## 2022-04-28 NOTE — Progress Notes (Signed)
Tommi Rumps, MD Phone: [phone]  Connor Byrd is a 51 y.o. male who presents today for f/u.  Depression: Patient reports this is significantly better.  The BuSpar has been helpful.  He is also on Lexapro.  He has not taken any hydroxyzine.  He uses the trazodone periodically for sleep.  No SI or anxiety.  He continues to do therapy.  Hyperlipidemia/prediabetes: Patient's diet is much better.  He reports he got better with improvement in his depression.  He is eating less junk.  He does eat canned vegetables.  He is walking more for exercise with his new dog.  He notes no family history of stroke or heart attack.  The 10-year ASCVD risk score (Arnett DK, et al., 2019) is: 5%   Values used to calculate the score:     Age: 57 years     Sex: Male     Is Non-Hispanic African American: No     Diabetic: No     Tobacco smoker: No     Systolic Blood Pressure: 546 mmHg     Is BP treated: Yes     HDL Cholesterol: 33.1 mg/dL     Total Cholesterol: 188 mg/dL  Respiratory infection: Patient reports he had a respiratory infection starting 3 weeks ago.  He still has some cough though is progressively improving.  Social History   Tobacco Use  Smoking Status Former   Packs/day: 1.50   Types: Cigarettes   Start date: 03/10/1987   Quit date: 01/03/2016   Years since quitting: 6.3  Smokeless Tobacco Never    Current Outpatient Medications on File Prior to Visit  Medication Sig Dispense Refill   busPIRone (BUSPAR) 7.5 MG tablet Take 1 tablet (7.5 mg total) by mouth 2 (two) times daily. 60 tablet 3   escitalopram (LEXAPRO) 20 MG tablet TAKE 1 AND 1/2 TABLET BY MOUTH DAILY 135 tablet 1   hydrOXYzine (ATARAX) 10 MG tablet TAKE ONE TABLET BY MOUTH THREE TIMES A DAY AS NEEDED FOR ANXIETY 30 tablet 0   tadalafil (CIALIS) 5 MG tablet TAKE 1/2 TABLET BY MOUTH DAILY AS NEEDED FOR ERECTILE DYSFUNCTION 45 tablet 1   traZODone (DESYREL) 50 MG tablet TAKE ONE TO TWO TABLETS BY MOUTH EVERY NIGHT AT  BEDTIME AS NEEDED FOR SLEEP 90 tablet 1   valsartan (DIOVAN) 80 MG tablet Take 1 tablet (80 mg total) by mouth daily. 90 tablet 3   No current facility-administered medications on file prior to visit.     ROS see history of present illness  Objective  Physical Exam Vitals:   04/28/22 1333  BP: 118/72  Pulse: 72  Temp: 98.1 F (36.7 C)  SpO2: 98%    BP Readings from Last 3 Encounters:  04/28/22 118/72  03/25/22 (!) 130/90  03/12/22 130/82   Wt Readings from Last 3 Encounters:  04/28/22 261 lb 9.6 oz (118.7 kg)  03/12/22 260 lb (117.9 kg)  03/05/22 261 lb 9.6 oz (118.7 kg)    Physical Exam Constitutional:      General: He is not in acute distress.    Appearance: He is not diaphoretic.  Cardiovascular:     Rate and Rhythm: Normal rate and regular rhythm.     Heart sounds: Normal heart sounds.  Pulmonary:     Effort: Pulmonary effort is normal.     Breath sounds: Normal breath sounds.  Skin:    General: Skin is warm and dry.  Neurological:     Mental Status: He is alert.  Assessment/Plan: Please see individual problem list.  Problem List Items Addressed This Visit     Anxiety and depression - Primary (Chronic)    Much improved.  He will continue buspirone 7.5 mg twice daily and Lexapro 30 mg daily.  Patient's Lexapro dose was noted after he left the office.  CMA will contact the patient to clarify his dosing.      High serum low-density lipoprotein (LDL) (Chronic)    Discussed he is at intermediate risk and could potentially benefit from a statin though could also work on diet and exercise.  Patient opted to continue diet and exercise.      Obesity (BMI 30-39.9) (Chronic)    Encouraged continued dietary changes and exercise.  Discussed eating frozen vegetables instead of canned vegetables.  He asked about zepbound and I advised that this would not be out until later in the year.  He will send me a message in early January to look into this.       Prediabetes (Chronic)    Continue diet and exercise.      Respiratory illness    Improving.  Encouraged to contact us if he has worsening symptoms or if his cough persists for more than 1 month.        Return in about 3 months (around 07/29/2022) for weight.   Tommi Rumps, MD [place]

## 2022-04-28 NOTE — Assessment & Plan Note (Signed)
Improving.  Encouraged to contact us if he has worsening symptoms or if his cough persists for more than 1 month.

## 2022-04-28 NOTE — Telephone Encounter (Signed)
Can you please call the patient and see if he is currently taking 1 tablet of Lexapro or 1.5 tablets of Lexapro?  If he is taking 1.5 tablets of Lexapro please find out when he increase that dose.  I am just trying to clarify his dosing as the typical maximum dose of Lexapro is 20 mg daily.  Thanks.

## 2022-04-28 NOTE — Assessment & Plan Note (Signed)
Much improved.  He will continue buspirone 7.5 mg twice daily and Lexapro 30 mg daily.  Patient's Lexapro dose was noted after he left the office.  CMA will contact the patient to clarify his dosing.

## 2022-04-28 NOTE — Assessment & Plan Note (Signed)
Continue diet and exercise. 

## 2022-04-28 NOTE — Assessment & Plan Note (Signed)
Discussed he is at intermediate risk and could potentially benefit from a statin though could also work on diet and exercise.  Patient opted to continue diet and exercise.

## 2022-04-28 NOTE — Assessment & Plan Note (Signed)
Encouraged continued dietary changes and exercise.  Discussed eating frozen vegetables instead of canned vegetables.  He asked about zepbound and I advised that this would not be out until later in the year.  He will send me a message in early January to look into this.

## 2022-04-28 NOTE — Telephone Encounter (Signed)
LVM for patient to call back.   Arla Boutwell,cma  

## 2022-04-29 NOTE — Telephone Encounter (Signed)
Called the patient and phone would not ring, I sent him a message on mychart.  Garek Schuneman,cma

## 2022-04-29 NOTE — Telephone Encounter (Signed)
I called and spoke with the patient and he stated he is taking  1.5 tablets of the Lexapro and he said this was increased years ago because he was originally given the 20 mg of the generic of the lexapro and it was not helping so the provider then told him to take a tablet and a half and it helped so he has been doing that every since.  Josanna Hefel,cma

## 2022-04-29 NOTE — Telephone Encounter (Signed)
Noted. Given that he is stable on this dosing we can leave him where he is.

## 2022-05-07 ENCOUNTER — Ambulatory Visit: Payer: BC Managed Care – PPO | Admitting: Nurse Practitioner

## 2022-05-19 ENCOUNTER — Encounter: Payer: Self-pay | Admitting: Family Medicine

## 2022-05-19 ENCOUNTER — Ambulatory Visit (INDEPENDENT_AMBULATORY_CARE_PROVIDER_SITE_OTHER): Payer: BC Managed Care – PPO | Admitting: Family Medicine

## 2022-05-19 VITALS — BP 150/80 | HR 86 | Temp 99.0°F | Ht 72.0 in | Wt 266.2 lb

## 2022-05-19 DIAGNOSIS — K1120 Sialoadenitis, unspecified: Secondary | ICD-10-CM | POA: Insufficient documentation

## 2022-05-19 DIAGNOSIS — J029 Acute pharyngitis, unspecified: Secondary | ICD-10-CM | POA: Diagnosis not present

## 2022-05-19 DIAGNOSIS — K1121 Acute sialoadenitis: Secondary | ICD-10-CM

## 2022-05-19 DIAGNOSIS — R051 Acute cough: Secondary | ICD-10-CM

## 2022-05-19 LAB — POCT INFLUENZA A/B
Influenza A, POC: NEGATIVE
Influenza B, POC: NEGATIVE

## 2022-05-19 LAB — POCT RAPID STREP A (OFFICE): Rapid Strep A Screen: NEGATIVE

## 2022-05-19 LAB — POC COVID19 BINAXNOW

## 2022-05-19 NOTE — Patient Instructions (Signed)
Please go to the ED for evaluation. I believe you have suppurative parotitis with possible spread of infection in to your neck tissue. This could progress rapidly and lead to compression of your throat structures. If you start to have any trouble breathing please call 911.

## 2022-05-19 NOTE — Progress Notes (Signed)
Tommi Rumps, MD Phone: [phone]  Connor Byrd is a 51 y.o. male who presents today for same-day visit.  HPI: patient presents with complaints of cough, sore throat, earache, and painful swollen left side of his face. He originally developed a cough in early November and went to [facility] on 11/11. He tested negative for Covid and they gave him antibiotics and steroids for presumed bronchitis. He reports that his cough has become dry but is still present.   On Saturday night 12/9, he developed a sore throat and earache, with a subsequent swelling of the parotid area overnight into the next morning. He states that it is tender to the touch and it is painful to swallow. He also reports body aches and some drainage in his throat. He denies loss of taste or smell and denies rhinorrhea. He notes rapid progression from Saturday to now.  Social History   Tobacco Use  Smoking Status Former   Packs/day: 1.50   Types: Cigarettes   Start date: 03/10/1987   Quit date: 01/03/2016   Years since quitting: 6.3  Smokeless Tobacco Never    Current Outpatient Medications on File Prior to Visit  Medication Sig Dispense Refill   busPIRone (BUSPAR) 7.5 MG tablet Take 1 tablet (7.5 mg total) by mouth 2 (two) times daily. 60 tablet 3   escitalopram (LEXAPRO) 20 MG tablet TAKE 1 AND 1/2 TABLET BY MOUTH DAILY 135 tablet 1   hydrOXYzine (ATARAX) 10 MG tablet TAKE ONE TABLET BY MOUTH THREE TIMES A DAY AS NEEDED FOR ANXIETY 30 tablet 0   tadalafil (CIALIS) 5 MG tablet TAKE 1/2 TABLET BY MOUTH DAILY AS NEEDED FOR ERECTILE DYSFUNCTION 45 tablet 1   traZODone (DESYREL) 50 MG tablet TAKE ONE TO TWO TABLETS BY MOUTH EVERY NIGHT AT BEDTIME AS NEEDED FOR SLEEP 90 tablet 1   valsartan (DIOVAN) 80 MG tablet Take 1 tablet (80 mg total) by mouth daily. 90 tablet 3   No current facility-administered medications on file prior to visit.     ROS see history of present illness  Objective   Vitals:    05/19/22 1606  BP: (!) 150/80  Pulse: 86  Temp: 99 F (37.2 C)  SpO2: 98%    BP Readings from Last 3 Encounters:  05/19/22 (!) 150/80  04/28/22 118/72  03/25/22 (!) 130/90   Wt Readings from Last 3 Encounters:  05/19/22 266 lb 3.2 oz (120.7 kg)  04/28/22 261 lb 9.6 oz (118.7 kg)  03/12/22 260 lb (117.9 kg)    Physical Exam Constitutional:      Appearance: Normal appearance.  HENT:     Head: Normocephalic and atraumatic.     Right Ear: Tympanic membrane normal.     Left Ear: Tympanic membrane normal.     Nose: No rhinorrhea.     Mouth/Throat:     Mouth: Mucous membranes are moist.     Pharynx: Posterior oropharyngeal erythema present. No oropharyngeal exudate.     Comments: Left parotid gland: Swollen, tender to the touch, warm, and erythematous. The swelling and erythema is centralized to the parotid gland but has spread to the post-auricular area and down his left neck down to his shirt collar. Voice sounds normal. No drainage from stensen's duct though it was swollen. Eyes:     Conjunctiva/sclera: Conjunctivae normal.  Cardiovascular:     Rate and Rhythm: Normal rate and regular rhythm.  Pulmonary:     Effort: Pulmonary effort is normal.     Breath sounds:  Normal breath sounds.  Musculoskeletal:     Cervical back: Tenderness present.  Skin:    General: Skin is warm and dry.  Neurological:     Mental Status: He is alert.  Psychiatric:        Mood and Affect: Mood normal.     Assessment/Plan: Please see individual problem list.  Problem List Items Addressed This Visit     Acute suppurative parotitis - Primary    Patient has a likely suppurative parotitis. Advised to patient that with the acute presentation and quickly worsening swelling/erythema/pain, the next best step would be to go to the emergency room. Explained to patient that oral antibiotics alone may not be enough to treat the infection and IV antibiotics will be more effective. Patient understands that  there is a risk for the infection to make his throat swell up and possibly obstruct his airway. He will proceed to go to the ED from here. Advised to call 911 if he developed trouble breathing prior to getting to the ED. Patient will go to [facility] in [place] as he does not want to go to [facility].       Other Visit Diagnoses     Sore throat       Relevant Orders   POCT rapid strep A (Completed)   Acute cough       Relevant Orders   POC COVID-19 (Completed)   POCT Influenza A/B (Completed)     Strep, rapid covid, flu testing all negative.    Return in about 1 week (around 05/26/2022) for f/u ED 1-2 weeks.   Marisa Cyphers, Medical Student [place]

## 2022-05-19 NOTE — Progress Notes (Signed)
Patient seen along with medical student Zada Zhong.  I personally evaluated this patient along with the student, and verified all aspects of the history, physical exam, and medical decision making as documented by the student.  I agree with the student's documentation and have made all necessary edits.  Mikle Sternberg, MD  

## 2022-05-19 NOTE — Assessment & Plan Note (Addendum)
Patient has a likely suppurative parotitis. Advised to patient that with the acute presentation and quickly worsening swelling/erythema/pain, the next best step would be to go to the emergency room. Explained to patient that oral antibiotics alone may not be enough to treat the infection and IV antibiotics will be more effective. Patient understands that there is a risk for the infection to make his throat swell up and possibly obstruct his airway. He will proceed to go to the ED from here. Advised to call 911 if he developed trouble breathing prior to getting to the ED. Patient will go to [facility] in [place] as he does not want to go to [facility].

## 2022-05-20 ENCOUNTER — Ambulatory Visit: Payer: BC Managed Care – PPO | Admitting: Nurse Practitioner

## 2022-05-28 ENCOUNTER — Ambulatory Visit: Payer: BC Managed Care – PPO | Admitting: Nurse Practitioner

## 2022-05-29 ENCOUNTER — Encounter: Payer: Self-pay | Admitting: Family Medicine

## 2022-05-29 ENCOUNTER — Ambulatory Visit: Payer: BC Managed Care – PPO | Admitting: Family Medicine

## 2022-05-29 VITALS — BP 118/80 | HR 84 | Temp 99.1°F | Ht 72.0 in | Wt 274.8 lb

## 2022-05-29 DIAGNOSIS — R0681 Apnea, not elsewhere classified: Secondary | ICD-10-CM | POA: Diagnosis not present

## 2022-05-29 DIAGNOSIS — K1121 Acute sialoadenitis: Secondary | ICD-10-CM | POA: Diagnosis not present

## 2022-05-29 NOTE — Assessment & Plan Note (Signed)
Significantly improved.  Patient will complete his antibiotics.  If he has any recurrence he will let us know immediately.  Discussed the potential to see ENT if he does have recurrence.

## 2022-05-29 NOTE — Progress Notes (Signed)
  Tommi Rumps, MD Phone: [phone]  Connor Byrd is a 51 y.o. male who presents today for f/u.  Parotitis: Patient notes he went to the emergency room and had CT imaging that did not reveal an abscess or stone.  They placed him on amoxicillin and discharged him.  He notes significant improvement in his symptoms.  Has had no fevers.  His last dose of antibiotics is today.  Witnessed apneic episodes: Patient has a sleep specialist that he would like a referral to.  Social History   Tobacco Use  Smoking Status Former   Packs/day: 1.50   Types: Cigarettes   Start date: 03/10/1987   Quit date: 01/03/2016   Years since quitting: 6.4  Smokeless Tobacco Never    Current Outpatient Medications on File Prior to Visit  Medication Sig Dispense Refill   busPIRone (BUSPAR) 7.5 MG tablet Take 1 tablet (7.5 mg total) by mouth 2 (two) times daily. 60 tablet 3   escitalopram (LEXAPRO) 20 MG tablet TAKE 1 AND 1/2 TABLET BY MOUTH DAILY 135 tablet 1   hydrOXYzine (ATARAX) 10 MG tablet TAKE ONE TABLET BY MOUTH THREE TIMES A DAY AS NEEDED FOR ANXIETY 30 tablet 0   tadalafil (CIALIS) 5 MG tablet TAKE 1/2 TABLET BY MOUTH DAILY AS NEEDED FOR ERECTILE DYSFUNCTION 45 tablet 1   traZODone (DESYREL) 50 MG tablet TAKE ONE TO TWO TABLETS BY MOUTH EVERY NIGHT AT BEDTIME AS NEEDED FOR SLEEP 90 tablet 1   valsartan (DIOVAN) 80 MG tablet Take 1 tablet (80 mg total) by mouth daily. 90 tablet 3   No current facility-administered medications on file prior to visit.     ROS see history of present illness  Objective  Physical Exam Vitals:   05/29/22 1215  BP: 118/80  Pulse: 84  Temp: 99.1 F (37.3 C)  SpO2: 97%    BP Readings from Last 3 Encounters:  05/29/22 118/80  05/19/22 (!) 150/80  04/28/22 118/72   Wt Readings from Last 3 Encounters:  05/29/22 274 lb 12.8 oz (124.6 kg)  05/19/22 266 lb 3.2 oz (120.7 kg)  04/28/22 261 lb 9.6 oz (118.7 kg)    Physical Exam Neck:     Comments: No  parotid gland swelling, no swelling in his neck, no overlying skin changes     Assessment/Plan: Please see individual problem list.  Acute suppurative parotitis Assessment & Plan: Significantly improved.  Patient will complete his antibiotics.  If he has any recurrence he will let us know immediately.  Discussed the potential to see ENT if he does have recurrence.   Witnessed apneic spells Assessment & Plan: Refer to the sleep specialist of his choice.  Orders: -     Ambulatory referral to Pulmonology    No follow-ups on file.   Tommi Rumps, MD [place]

## 2022-05-29 NOTE — Assessment & Plan Note (Signed)
Refer to the sleep specialist of his choice.

## 2022-06-11 ENCOUNTER — Ambulatory Visit: Payer: BC Managed Care – PPO | Admitting: Nurse Practitioner

## 2022-06-11 DIAGNOSIS — G4733 Obstructive sleep apnea (adult) (pediatric): Secondary | ICD-10-CM | POA: Diagnosis not present

## 2022-06-11 DIAGNOSIS — R0683 Snoring: Secondary | ICD-10-CM

## 2022-06-17 ENCOUNTER — Telehealth: Payer: Self-pay | Admitting: Pulmonary Disease

## 2022-06-17 DIAGNOSIS — G4733 Obstructive sleep apnea (adult) (pediatric): Secondary | ICD-10-CM

## 2022-06-17 NOTE — Telephone Encounter (Signed)
Call patient  Sleep study result  Date of study: 06/11/2022  Impression: Severe obstructive sleep apnea Severe oxygen desaturations  Recommendation:  Recommend in lab sleep study to titrate to CPAP, may need oxygen supplementation  Encourage weight loss efforts  Follow-up as previously scheduled

## 2022-06-18 NOTE — Progress Notes (Signed)
Please notify patient his his HST revealed severe OSA with AHI 59.7/h; SpO2 low 61% with average 89%. Given the severity of his OSA, he will need to start CPAP therapy for management. He will also need CPAP titration in the lab to ensure he does not need supplemental oxygen bled through the CPAP at night. If he is ok to move forward with this, we can postpone our follow up until after his sleep study and go ahead and start him on CPAP therapy. Let me know so we can put in orders. Thanks.

## 2022-06-18 NOTE — Telephone Encounter (Signed)
See my result message. If he is ok to move forward with CPAP therapy, we will place orders for this (auto 5-20 cmH2O, mask of choice and heated humidification) as well as CPAP titration. Thanks.

## 2022-06-18 NOTE — Telephone Encounter (Signed)
Called the pt and there was no answer- LMTCB    

## 2022-06-19 ENCOUNTER — Ambulatory Visit: Payer: BC Managed Care – PPO | Admitting: Nurse Practitioner

## 2022-06-24 ENCOUNTER — Other Ambulatory Visit: Payer: Self-pay

## 2022-06-24 DIAGNOSIS — G4733 Obstructive sleep apnea (adult) (pediatric): Secondary | ICD-10-CM

## 2022-06-25 ENCOUNTER — Ambulatory Visit: Payer: BC Managed Care – PPO | Admitting: Nurse Practitioner

## 2022-06-25 NOTE — Progress Notes (Signed)
CPAP auto 5-20 cmH2O, mask of choice and heated humidification. Thanks.

## 2022-06-27 ENCOUNTER — Other Ambulatory Visit: Payer: Self-pay

## 2022-06-27 DIAGNOSIS — G4733 Obstructive sleep apnea (adult) (pediatric): Secondary | ICD-10-CM

## 2022-07-03 NOTE — Telephone Encounter (Signed)
CPAP orders placed for this pt on 1/19 NFN at this time

## 2022-07-07 ENCOUNTER — Other Ambulatory Visit: Payer: Self-pay | Admitting: Family Medicine

## 2022-07-07 DIAGNOSIS — F419 Anxiety disorder, unspecified: Secondary | ICD-10-CM

## 2022-07-22 ENCOUNTER — Other Ambulatory Visit: Payer: Self-pay

## 2022-07-22 ENCOUNTER — Encounter: Payer: Self-pay | Admitting: Family Medicine

## 2022-07-22 DIAGNOSIS — N529 Male erectile dysfunction, unspecified: Secondary | ICD-10-CM

## 2022-07-22 MED ORDER — TADALAFIL 5 MG PO TABS: ORAL_TABLET | ORAL | 1 refills | Status: DC

## 2022-07-30 ENCOUNTER — Ambulatory Visit: Payer: BC Managed Care – PPO | Admitting: Family Medicine

## 2022-08-27 ENCOUNTER — Ambulatory Visit (HOSPITAL_BASED_OUTPATIENT_CLINIC_OR_DEPARTMENT_OTHER): Payer: BC Managed Care – PPO | Attending: Nurse Practitioner | Admitting: Internal Medicine

## 2022-08-27 VITALS — Ht 72.0 in | Wt 280.0 lb

## 2022-08-27 DIAGNOSIS — G4733 Obstructive sleep apnea (adult) (pediatric): Secondary | ICD-10-CM | POA: Insufficient documentation

## 2022-08-27 DIAGNOSIS — G4731 Primary central sleep apnea: Secondary | ICD-10-CM | POA: Diagnosis present

## 2022-08-29 ENCOUNTER — Encounter: Payer: Self-pay | Admitting: Family Medicine

## 2022-08-29 NOTE — Telephone Encounter (Signed)
I'm not aware of any other discounts or coupons for Zepbound

## 2022-08-31 DIAGNOSIS — G4733 Obstructive sleep apnea (adult) (pediatric): Secondary | ICD-10-CM

## 2022-08-31 NOTE — Procedures (Signed)
Patient Name: Connor Byrd, Connor Byrd Date: 08/27/2022 Gender: Male D.O.B: Nov 04, 1970 Age (years): 13 Referring Provider: Clayton Bibles NP Height (inches): 72 Interpreting Physician: Baird Lyons MD, ABSM Weight (lbs): 280 RPSGT: Jorge Ny BMI: 38 MRN: [id number] Neck Size: 18.75  CLINICAL INFORMATION The patient is referred for a CPAP titration to treat sleep apnea.  Date of NPSG, Split Night or HST:   HST 06/11/22  AHI 59.7/ hr, desaturation to 61%, body weight 274 lbs  SLEEP STUDY TECHNIQUE As per the AASM Manual for the Scoring of Sleep and Associated Events v2.3 (April 2016) with a hypopnea requiring 4% desaturations.  The channels recorded and monitored were frontal, central and occipital EEG, electrooculogram (EOG), submentalis EMG (chin), nasal and oral airflow, thoracic and abdominal wall motion, anterior tibialis EMG, snore microphone, electrocardiogram, and pulse oximetry. Continuous positive airway pressure (CPAP) was initiated at the beginning of the study and titrated to treat sleep-disordered breathing.  MEDICATIONS Medications self-administered by patient taken the night of the study : BUSPIRONE HCL, TRAZODONE  TECHNICIAN COMMENTS Comments added by technician: Patient had difficulty initiating sleep. Patient was restless all through the night. Comments added by scorer: N/A  RESPIRATORY PARAMETERS Optimal PAP Pressure (cm):  AHI at Optimal Pressure (/hr): N/A Overall Minimal O2 (%): 88.0 Supine % at Optimal Pressure (%): N/A Minimal O2 at Optimal Pressure (%): 88.0   SLEEP ARCHITECTURE The study was initiated at 10:29:29 PM and ended at 11:57:53 PM.  Sleep onset time was 25.7 minutes and the sleep efficiency was 25.5%. The total sleep time was 22.5 minutes.  The patient spent 22.2% of the night in stage N1 sleep, 77.8% in stage N2 sleep, 0.0% in stage N3 and 0% in REM.Stage REM latency was N/A minutes  Wake after sleep onset was 40.2. Alpha  intrusion was absent. Supine sleep was 28.89%.  CARDIAC DATA The 2 lead EKG demonstrated sinus rhythm. The mean heart rate was 64.6 beats per minute. Other EKG findings include: None.  LEG MOVEMENT DATA The total Periodic Limb Movements of Sleep (PLMS) were 0. The PLMS index was 0.0. A PLMS index of <15 is considered normal in adults.  IMPRESSIONS - An optimal PAP pressure could not be selected for this patient based on the available study data. Study limited due to patient having panic attacks. Patient signed out AMA and study was ended at 11:58 PM. - Patient was initially fitted with an F&P Evora full face, size large mask, but changed to his own ResMed AirFit F2o size medium after panic with the Reform. - Severe Central Sleep Apnea was noted during this titration (CAI = 45.3/h). - Mild oxygen desaturations were observed during this titration (min O2 = 88.0%, Mean 93.4%). - The patient snored with soft snoring volume during this titration study. - No cardiac abnormalities were observed during this study. - Clinically significant periodic limb movements were not noted during this study.   DIAGNOSIS - Obstructive Sleep Apnea (G47.33) - Central Sleep Apnea  RECOMMENDATIONS - Consider a trial of Auto-BiPAP 6-14 cm H2O. Patient did not tolerate CPAP. - Suggest scheduled Mask desensitization. - Sleep hygiene should be reviewed to assess factors that may improve sleep quality. - Weight management and regular exercise should be initiated or continued.  [Electronically signed] 08/31/2022 10:36 AM  Baird Lyons MD, [place], American Board of Sleep Medicine NPI: [id number]  [place], Tax adviser of Sleep Medicine  ELECTRONICALLY SIGNED ON:  08/31/2022, 10:23 AM [place] PH: [phone]   FX: [phone] [place]

## 2022-09-08 ENCOUNTER — Ambulatory Visit: Payer: BC Managed Care – PPO | Admitting: Family Medicine

## 2022-09-08 ENCOUNTER — Ambulatory Visit: Payer: BC Managed Care – PPO | Admitting: Nurse Practitioner

## 2022-09-08 ENCOUNTER — Encounter: Payer: Self-pay | Admitting: Nurse Practitioner

## 2022-09-08 ENCOUNTER — Encounter: Payer: Self-pay | Admitting: Family Medicine

## 2022-09-08 VITALS — BP 118/74 | HR 71 | Ht 72.0 in | Wt 285.0 lb

## 2022-09-08 VITALS — BP 122/76 | HR 78 | Temp 98.3°F | Ht 72.0 in | Wt 279.0 lb

## 2022-09-08 DIAGNOSIS — F32A Depression, unspecified: Secondary | ICD-10-CM

## 2022-09-08 DIAGNOSIS — E669 Obesity, unspecified: Secondary | ICD-10-CM | POA: Diagnosis not present

## 2022-09-08 DIAGNOSIS — F5102 Adjustment insomnia: Secondary | ICD-10-CM

## 2022-09-08 DIAGNOSIS — F419 Anxiety disorder, unspecified: Secondary | ICD-10-CM

## 2022-09-08 DIAGNOSIS — J3089 Other allergic rhinitis: Secondary | ICD-10-CM | POA: Diagnosis not present

## 2022-09-08 DIAGNOSIS — I10 Essential (primary) hypertension: Secondary | ICD-10-CM

## 2022-09-08 DIAGNOSIS — G4733 Obstructive sleep apnea (adult) (pediatric): Secondary | ICD-10-CM

## 2022-09-08 DIAGNOSIS — J309 Allergic rhinitis, unspecified: Secondary | ICD-10-CM | POA: Insufficient documentation

## 2022-09-08 DIAGNOSIS — G4739 Other sleep apnea: Secondary | ICD-10-CM | POA: Diagnosis not present

## 2022-09-08 LAB — BASIC METABOLIC PANEL
BUN: 8 mg/dL (ref 6–23)
CO2: 26 mEq/L (ref 19–32)
Calcium: 9.4 mg/dL (ref 8.4–10.5)
Chloride: 106 mEq/L (ref 96–112)
Creatinine, Ser: 0.71 mg/dL (ref 0.40–1.50)
GFR: 106.99 mL/min (ref >60.00)
Glucose, Bld: 88 mg/dL (ref 70–99)
Potassium: 4.2 mEq/L (ref 3.5–5.1)
Sodium: 138 mEq/L (ref 135–145)

## 2022-09-08 LAB — TSH: TSH: 0.63 u[IU]/mL (ref 0.35–5.50)

## 2022-09-08 MED ORDER — AZELASTINE HCL 0.1 % NA SOLN
2.0000 | Freq: Two times a day (BID) | NASAL | 5 refills | Status: DC
Start: 2022-09-08 — End: 2023-09-09

## 2022-09-08 MED ORDER — LEVOCETIRIZINE DIHYDROCHLORIDE 5 MG PO TABS
5.0000 mg | ORAL_TABLET | Freq: Every evening | ORAL | 5 refills | Status: DC
Start: 2022-09-08 — End: 2023-09-09

## 2022-09-08 MED ORDER — ZOLPIDEM TARTRATE 5 MG PO TABS
5.0000 mg | ORAL_TABLET | Freq: Every evening | ORAL | 0 refills | Status: DC | PRN
Start: 2022-09-08 — End: 2023-09-09

## 2022-09-08 NOTE — Progress Notes (Signed)
@Patient  ID: Connor Byrd, male    DOB: [birth year], 52 y.o.   MRN: [id number]  Chief Complaint  Patient presents with   Follow-up    Pt f/u he states that his CPAP is giving him issues, he feels like he has some anxiety bc of his wife being on life support.     Referring provider: Leone Haven, MD  HPI: 52 year old male, former smoker referred for sleep consult.  Past medical history significant for GERD, obesity, insomnia, anxiety/depression.  TEST/EVENTS:  06/11/2022 HST: AHI 59.7/h, SpO2 low 61% 08/27/2022 NPSG: total sleep time 22.5 min; optimal PAP pressure unable to be selected d/t panic attacks. Severe central apneic events noted (CAI = 45.3/h)  03/12/2022: Ok Edwards with Mansour Balboa NP for sleep consult referred by his PCP, Dr. Caryl Bis.  He has struggled with insomnia for quite some time now; has difficulties with both falling asleep and staying asleep.  He is currently on trazodone.  He takes anywhere from 50 mg to 100 mg at night.  He has been told his whole life that he snores.  He recently was told by his girlfriend that she noticed that he stops breathing when he sleeps and will gasp before he starts breathing again.  He also has struggled with some restless leg at night.  Wakes in the morning feeling poorly rested with dry mouth.  Fatigued during the day. Denies any morning headache, drowsy driving, sleep parasomnia/paralysis, symptoms of narcolepsy or cataplexy. He goes to bed around 11 PM.  Typically falls asleep within 20 minutes using trazodone.  Wakes 3-4 times a night.  Officially gets out of bed in the morning around 7 AM.  He drinks a couple coffee in the morning and occasionally has a Diet Coke at lunch but no other significant caffeine intake.  His weight is down 40 pounds over the last 2 years.  He has never had a sleep study before. He has a history of high blood pressure, well controlled on antihypertensives.  No history of stroke.  Past family history significant for  grandfather with emphysema and mother with cancer.  He is a former smoker.  Drinks alcohol socially; usually 2-3 drinks on the weekends.  He also occasionally smokes marijuana.  No other illicit drug use. He is widowed.  Lives at home with his son.  Works in [employer].  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 [place] [address]  09/08/2022: Today - follow up Patient presents today for follow up. Since he was here last, he had a home sleep study which revealed severe sleep apnea and oxygen desaturations. He was started on CPAP therapy and then advised to undergo CPAP titration to ensure he was adequately controlled. He went to his sleep study. He was able to sleep for a little bit but he had a lot of anxiety so his study was stopped after only a few hours. He was found during the test to have severe central apneas and it was recommended that he try auto BiPAP as he did not tolerate CPAP during the test.   Today, he tells me that he has been trying to use his CPAP. He's had a lot of trouble adjusting to something on his face. His late wife was on life support and so the CPAP mask/machine reminds him of this and causes him anxiety. He feels like he's slowly adjusting to it and these feelings are getting better, but sometimes it does cause him to struggle to fall asleep. He's  tried his trazodone but this doesn't really work for him and leaves him with a very dry mouth. He does feel like he has trouble breathing out with the CPAP and doesn't always feel like he has enough air with it. He does feel like on the nights he wore it for most of the night, he wakes feeling more rested and sleeps more soundly. He denies any morning headaches, drowsy driving, sleep parasomnias/paralysis. He did sleep walk as a child but has never done this in adulthood. He's tried both a full face mask and a DreamWear mask. He prefers the full face F20 because the DreamWear full face pushed against his septum. Never tried a nasal mask.    He has also been having some trouble with his sinuses. He feels like they are always full/congested and he has some postnasal drainage. They do tend to get worse with allergy season. He has tried flonase before but it made him jittery. He also tried a different nasal spray in the past but it dried him out really bad. He can't remember the name of this. He is not taking a daily allergy pill. No other over the counter medications. He denies any fevers, chills, headaches, sore throat, cough.   08/09/2022-09/07/2022: CPAP 4-20 cmH2O 21/30 days; 43% > 4 hr; av use 3 hr 4 min Pressure 95th 11.9   No Known Allergies  Immunization History  Administered Date(s) Administered   Influenza, Quadrivalent, Recombinant, Inj, Pf 04/02/2019   Influenza,inj,Quad PF,6+ Mos 05/27/2017, 05/28/2018, 03/05/2022   Influenza-Unspecified 05/24/2021   Moderna Covid-19 Vaccine Bivalent Booster 41yrs & up 05/24/2021   Moderna Sars-Covid-2 Vaccination 08/25/2019, 09/27/2019   Tdap 05/27/2017    Past Medical History:  Diagnosis Date   Depression    Insomnia     Tobacco History: Social History   Tobacco Use  Smoking Status Former   Packs/day: 1.5   Types: Cigarettes   Start date: 03/10/1987   Quit date: 01/03/2016   Years since quitting: 6.6  Smokeless Tobacco Never   Counseling given: Not Answered   Outpatient Medications Prior to Visit  Medication Sig Dispense Refill   busPIRone (BUSPAR) 7.5 MG tablet TAKE 1 TABLET BY MOUTH TWICE A DAY 60 tablet 3   escitalopram (LEXAPRO) 20 MG tablet TAKE 1 AND 1/2 TABLET BY MOUTH DAILY 135 tablet 1   hydrOXYzine (ATARAX) 10 MG tablet TAKE ONE TABLET BY MOUTH THREE TIMES A DAY AS NEEDED FOR ANXIETY 30 tablet 0   tadalafil (CIALIS) 5 MG tablet TAKE 1/2 TABLET BY MOUTH DAILY AS NEEDED FOR ERECTILE DYSFUNCTION 45 tablet 1   valsartan (DIOVAN) 80 MG tablet Take 1 tablet (80 mg total) by mouth daily. 90 tablet 3   traZODone (DESYREL) 50 MG tablet TAKE ONE TO TWO TABLETS  BY MOUTH EVERY NIGHT AT BEDTIME AS NEEDED FOR SLEEP 90 tablet 1   No facility-administered medications prior to visit.     Review of Systems:   Constitutional: No weight loss or gain, night sweats, fevers, chills, or lassitude. +excessive daytime fatigue  HEENT: No headaches, difficulty swallowing, tooth/dental problems, or sore throat. No sneezing, itching, ear ache. +nasal congestion, post nasal drip CV:  No chest pain, orthopnea, PND, swelling in lower extremities, anasarca, dizziness, palpitations, syncope Resp: +snoring, witnessed nocturnal apneas. No shortness of breath with exertion or at rest. No excess mucus or change in color of mucus. No productive or non-productive. No hemoptysis. No wheezing.  No chest wall deformity GI:  No heartburn, indigestion, abdominal pain,  nausea, vomiting, diarrhea, change in bowel habits, loss of appetite, bloody stools.  GU: No dysuria, change in color of urine, urgency or frequency.   Skin: No rash, lesions, ulcerations MSK:  No joint pain or swelling.   Neuro: No dizziness or lightheadedness.  Psych: Mood stable. No SI/HI. +sleep disturbance, anxiety    Physical Exam:  BP 118/74   Pulse 71   Ht 6' (1.829 m)   Wt 285 lb (129.3 kg)   SpO2 99%   BMI 38.65 kg/m   GEN: Pleasant, interactive, well-appearing; obese; in no acute distress. HEENT:  Normocephalic and atraumatic. PERRLA. Sclera white. Nasal turbinates pink, moist and patent bilaterally. No rhinorrhea present. Oropharynx pink and moist, without exudate or edema. No lesions, ulcerations, or postnasal drip. Mallampati III NECK:  Supple w/ fair ROM. No JVD present. Normal carotid impulses w/o bruits. Thyroid symmetrical with no goiter or nodules palpated. No lymphadenopathy.   CV: RRR, no m/r/g, no peripheral edema. Pulses intact, +2 bilaterally. No cyanosis, pallor or clubbing. PULMONARY:  Unlabored, regular breathing. Clear bilaterally A&P w/o wheezes/rales/rhonchi. No accessory muscle  use.  GI: BS present and normoactive. Soft, non-tender to palpation. No organomegaly or masses detected.  MSK: No erythema, warmth or tenderness. No deformities or joint swelling noted.  Neuro: A/Ox3. No focal deficits noted.   Skin: Warm, no lesions or rashe Psych: Normal affect and behavior. Judgement and thought content appropriate.     Lab Results:  CBC    Component Value Date/Time   WBC 8.9 04/23/2022 0805   RBC 5.24 04/23/2022 0805   HGB 16.1 04/23/2022 0805   HCT 46.6 04/23/2022 0805   PLT 324.0 04/23/2022 0805   MCV 88.9 04/23/2022 0805   MCH 31.1 05/15/2020 2351   MCHC 34.5 04/23/2022 0805   RDW 13.1 04/23/2022 0805   LYMPHSABS 1.7 05/15/2020 2351   MONOABS 0.8 05/15/2020 2351   EOSABS 0.1 05/15/2020 2351   BASOSABS 0.0 05/15/2020 2351    BMET    Component Value Date/Time   NA 138 09/08/2022 1206   K 4.2 09/08/2022 1206   CL 106 09/08/2022 1206   CO2 26 09/08/2022 1206   GLUCOSE 88 09/08/2022 1206   BUN 8 09/08/2022 1206   CREATININE 0.71 09/08/2022 1206   CALCIUM 9.4 09/08/2022 1206   GFRNONAA >60 05/15/2020 2351    BNP No results found for: "BNP"   Imaging:  SLEEP STUDY DOCUMENTS  Result Date: 08/29/2022 Ordered by an unspecified provider.         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 He has been on CPAP with breakthrough events and AHI 7.9/h (CAI 2.5). He was intended to have a CPAP titration; however, he was only able to sleep for 22.5 minutes due to a panic attack. It was advised he trial auto BiPAP due to the severity of his central events. He has since felt like he is getting better adjusted to his CPAP. He still has trouble with sleep onset and also feeling like he doesn't get enough air. We will send orders to change him to auto BiPAP EPAP min 6, IPAP max 14, PS 4. I am also going to stop his  trazodone and start him on low dose ambien for adjustment insomnia. Side effect profile reviewed. Understands to not drive after taking and to  ensure he does not take it without using his CPAP/BiPAP. Healthy weight loss encouraged. Understands risks of untreated OSA.  Patient Instructions  Change CPAP to BiPAP - orders placed today. Someone from the medical supply company will contact you about this. We will have you continue therapy with a full face mask but we can consider changing if you still are having trouble adjusting Change equipment every 30 days or as directed by DME. Wash your tubing with warm soap and water daily, hang to dry. Wash humidifier portion weekly.  Be aware of reduced alertness and do not drive or operate heavy machinery if experiencing this or drowsiness. Notify if persistent daytime sleepiness occurs even with consistent use of CPAP.  Stop trazodone. Start Ambien 5 mg At bedtime as needed for sleep. Make sure you have 7-8 hours in the bed to sleep after taking. Do not drive after taking. Make sure you apply your BiPAP within 10-15 minutes after taking and avoid falling asleep without it as the Ambien can worsen your sleep apnea.  For your sinus symptoms/allergies start... Levocetirizine 1 tab daily for allergies/nasal congestion Saline nasal rinses 1-2 times a day for nasal congestion. Use distilled water. Read instructions before use Astelin nasal spray 1-2 sprays each nostril Twice daily for nasal congestion  Follow up in 6 weeks with Dr. Ander Slade or Alanson Aly. If symptoms do not improve or worsen, please contact office for sooner follow up or seek emergency care.    Insomnia See above plan. Sleep hygiene reviewed.   Allergic rhinitis Advised him to start daily oral and nasal antihistamine as he cannot tolerate intranasal steroid. Side effect profiles reviewed. He will also start saline rinses 1-2 times a day until symptoms improve then as needed. If symptoms  persist or do not improve, we can consider further workup or referral to ENT.   Anxiety and depression Stable. He takes lexapro and buspar, which have been controlling his symptoms well. He is managed by his PCP. No SI/HI. Understands to monitor his mood closely with use of ambien, stop immediately and notify if worsening develops.     I spent 42 minutes of dedicated to the care of this patient on the date of this encounter to include pre-visit review of records, face-to-face time with the patient discussing conditions above, post visit ordering of testing, clinical documentation with the electronic health record, making appropriate referrals as documented, and communicating necessary findings to members of the patients care team.  Clayton Bibles, NP 09/08/2022  Pt aware and understands NP's role.

## 2022-09-08 NOTE — Assessment & Plan Note (Signed)
Chronic issue.  He will continue his CPAP nightly.  Advised he discuss his claustrophobia issues with his specialist to see if they can get a different mask or if there is some kind of medication to help him get through that.

## 2022-09-08 NOTE — Assessment & Plan Note (Signed)
Chronic issue.  Encouraged continued diet and exercise.  Discussed trying exercise when he is on the road for work and eat the healthiest option that he has available to him when he has to eat out with work.  Discussed at this time I do not know when the cost of weight loss injections will come down.

## 2022-09-08 NOTE — Assessment & Plan Note (Signed)
Stable. He takes lexapro and buspar, which have been controlling his symptoms well. He is managed by his PCP. No SI/HI. Understands to monitor his mood closely with use of ambien, stop immediately and notify if worsening develops.

## 2022-09-08 NOTE — Assessment & Plan Note (Signed)
See above plan. Sleep hygiene reviewed.

## 2022-09-08 NOTE — Assessment & Plan Note (Signed)
Recent diagnosis.  Adequately controlled today.  He will continue valsartan 80 mg daily.  Discussed getting lab work and checking a urine microalbumin creatinine ratio today.  He noted he would not be able to urinate for [place] so we will plan on doing the urine testing at his next visit.

## 2022-09-08 NOTE — Progress Notes (Signed)
Tommi Rumps, MD Phone: [phone]  Connor Byrd is a 52 y.o. male who presents today for f/u.  HYPERTENSION Disease Monitoring Home BP Monitoring not checking Chest pain- no    Dyspnea- no Medications Compliance-  taking valsartan.   Edema- no BMET    Component Value Date/Time   NA 141 04/23/2022 0805   K 3.9 04/23/2022 0805   CL 102 04/23/2022 0805   CO2 31 04/23/2022 0805   GLUCOSE 97 04/23/2022 0805   BUN 14 04/23/2022 0805   CREATININE 0.76 04/23/2022 0805   CALCIUM 9.3 04/23/2022 0805   GFRNONAA >60 05/15/2020 2351   Anxiety/depression: Patient notes these are much better.  He is on BuSpar and Lexapro.  He has not required hydroxyzine or trazodone recently.  He notes the trazodone did give him dry mouth so we stopped it.  No SI.  Obesity: Patient notes his diet has been inconsistent given that he is traveling for work.  He does eat fairly healthy only when he is at home.  He walks the dog most days when he is at home.  OSA: He is now on CPAP.  He has been having issues with claustrophobia with the mask so he does not put it on till 2 AM and then wears it from 2 AM to 6 AM.  He follows up with his specialist today.  Social History   Tobacco Use  Smoking Status Former   Packs/day: 1.5   Types: Cigarettes   Start date: 03/10/1987   Quit date: 01/03/2016   Years since quitting: 6.6  Smokeless Tobacco Never    Current Outpatient Medications on File Prior to Visit  Medication Sig Dispense Refill   busPIRone (BUSPAR) 7.5 MG tablet TAKE 1 TABLET BY MOUTH TWICE A DAY 60 tablet 3   escitalopram (LEXAPRO) 20 MG tablet TAKE 1 AND 1/2 TABLET BY MOUTH DAILY 135 tablet 1   hydrOXYzine (ATARAX) 10 MG tablet TAKE ONE TABLET BY MOUTH THREE TIMES A DAY AS NEEDED FOR ANXIETY 30 tablet 0   tadalafil (CIALIS) 5 MG tablet TAKE 1/2 TABLET BY MOUTH DAILY AS NEEDED FOR ERECTILE DYSFUNCTION 45 tablet 1   traZODone (DESYREL) 50 MG tablet TAKE ONE TO TWO TABLETS BY MOUTH EVERY NIGHT  AT BEDTIME AS NEEDED FOR SLEEP 90 tablet 1   valsartan (DIOVAN) 80 MG tablet Take 1 tablet (80 mg total) by mouth daily. 90 tablet 3   No current facility-administered medications on file prior to visit.     ROS see history of present illness  Objective  Physical Exam Vitals:   09/08/22 1150  BP: 122/76  Pulse: 78  Temp: 98.3 F (36.8 C)  SpO2: 96%    BP Readings from Last 3 Encounters:  09/08/22 122/76  05/29/22 118/80  05/19/22 (!) 150/80   Wt Readings from Last 3 Encounters:  09/08/22 279 lb (126.6 kg)  08/27/22 280 lb (127 kg)  05/29/22 274 lb 12.8 oz (124.6 kg)    Physical Exam Constitutional:      General: He is not in acute distress.    Appearance: He is not diaphoretic.  Cardiovascular:     Rate and Rhythm: Normal rate and regular rhythm.     Heart sounds: Normal heart sounds.  Pulmonary:     Effort: Pulmonary effort is normal.     Breath sounds: Normal breath sounds.  Skin:    General: Skin is warm and dry.  Neurological:     Mental Status: He is alert.  Assessment/Plan: Please see individual problem list.  Primary hypertension Assessment & Plan: Recent diagnosis.  Adequately controlled today.  He will continue valsartan 80 mg daily.  Discussed getting lab work and checking a urine microalbumin creatinine ratio today.  He noted he would not be able to urinate for [place] so we will plan on doing the urine testing at his next visit.  Orders: -     Basic metabolic panel -     TSH  OSA (obstructive sleep apnea) Assessment & Plan: Chronic issue.  He will continue his CPAP nightly.  Advised he discuss his claustrophobia issues with his specialist to see if they can get a different mask or if there is some kind of medication to help him get through that.   Obesity (BMI 30-39.9) Assessment & Plan: Chronic issue.  Encouraged continued diet and exercise.  Discussed trying exercise when he is on the road for work and eat the healthiest option that he  has available to him when he has to eat out with work.  Discussed at this time I do not know when the cost of weight loss injections will come down.   Anxiety and depression Assessment & Plan: Chronic issue.  Adequately controlled.  He will continue buspirone 7.5 mg twice daily and Lexapro 30 mg daily.      Return in about 6 months (around 03/10/2023) for htn.   Tommi Rumps, MD [place]

## 2022-09-08 NOTE — Assessment & Plan Note (Signed)
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 He has been on CPAP with breakthrough events and AHI 7.9/h (CAI 2.5). He was intended to have a CPAP titration; however, he was only able to sleep for 22.5 minutes due to a panic attack. It was advised he trial auto BiPAP due to the severity of his central events. He has since felt like he is getting better adjusted to his CPAP. He still has trouble with sleep onset and also feeling like he doesn't get enough air. We will send orders to change him to auto BiPAP EPAP min 6, IPAP max 14, PS 4. I am also going to stop his trazodone and start him on low dose ambien for adjustment insomnia. Side effect profile reviewed. Understands to not drive after taking and to ensure he does not take it without using his CPAP/BiPAP. Healthy weight loss encouraged. Understands risks of untreated OSA.  Patient Instructions  Change CPAP to BiPAP - orders placed today. Someone from the medical supply company will contact you about this. We will have you continue therapy with a full face mask but we can consider changing if you still are having trouble adjusting Change equipment every 30 days or as directed by DME. Wash your tubing with warm soap and water daily, hang to dry. Wash humidifier portion weekly.  Be aware of reduced alertness and do not drive or operate heavy machinery if experiencing this or drowsiness. Notify if persistent daytime sleepiness occurs even with consistent use of CPAP.  Stop trazodone. Start Ambien 5 mg At bedtime as needed for sleep. Make sure you have 7-8 hours in the bed to sleep after taking. Do not drive after taking. Make sure you apply your BiPAP within 10-15 minutes after taking and avoid falling asleep without it as the Ambien can worsen your sleep apnea.  For your sinus symptoms/allergies start... Levocetirizine 1 tab daily for allergies/nasal congestion Saline nasal rinses 1-2 times a day for nasal congestion. Use distilled water. Read  instructions before use Astelin nasal spray 1-2 sprays each nostril Twice daily for nasal congestion  Follow up in 6 weeks with Dr. Ander Slade or Alanson Aly. If symptoms do not improve or worsen, please contact office for sooner follow up or seek emergency care.

## 2022-09-08 NOTE — Assessment & Plan Note (Signed)
Chronic issue.  Adequately controlled.  He will continue buspirone 7.5 mg twice daily and Lexapro 30 mg daily.

## 2022-09-08 NOTE — Patient Instructions (Addendum)
Change CPAP to BiPAP - orders placed today. Someone from the medical supply company will contact you about this. We will have you continue therapy with a full face mask but we can consider changing if you still are having trouble adjusting Change equipment every 30 days or as directed by DME. Wash your tubing with warm soap and water daily, hang to dry. Wash humidifier portion weekly.  Be aware of reduced alertness and do not drive or operate heavy machinery if experiencing this or drowsiness. Notify if persistent daytime sleepiness occurs even with consistent use of CPAP.  Stop trazodone. Start Ambien 5 mg At bedtime as needed for sleep. Make sure you have 7-8 hours in the bed to sleep after taking. Do not drive after taking. Make sure you apply your BiPAP within 10-15 minutes after taking and avoid falling asleep without it as the Ambien can worsen your sleep apnea.  For your sinus symptoms/allergies start... Levocetirizine 1 tab daily for allergies/nasal congestion Saline nasal rinses 1-2 times a day for nasal congestion. Use distilled water. Read instructions before use Astelin nasal spray 1-2 sprays each nostril Twice daily for nasal congestion  Follow up in 6 weeks with Dr. Ander Slade or Alanson Aly. If symptoms do not improve or worsen, please contact office for sooner follow up or seek emergency care.

## 2022-09-08 NOTE — Assessment & Plan Note (Signed)
Advised him to start daily oral and nasal antihistamine as he cannot tolerate intranasal steroid. Side effect profiles reviewed. He will also start saline rinses 1-2 times a day until symptoms improve then as needed. If symptoms persist or do not improve, we can consider further workup or referral to ENT.

## 2022-09-21 ENCOUNTER — Encounter: Payer: Self-pay | Admitting: Family Medicine

## 2022-09-22 MED ORDER — ZEPBOUND 2.5 MG/0.5ML ~~LOC~~ SOAJ: 2.5000 mg | SUBCUTANEOUS | 0 refills | Status: DC

## 2022-10-09 ENCOUNTER — Other Ambulatory Visit: Payer: Self-pay | Admitting: Family Medicine

## 2022-10-09 DIAGNOSIS — F32A Depression, unspecified: Secondary | ICD-10-CM

## 2022-10-22 ENCOUNTER — Ambulatory Visit: Payer: BC Managed Care – PPO | Admitting: Nurse Practitioner

## 2022-11-27 ENCOUNTER — Ambulatory Visit: Payer: BC Managed Care – PPO | Admitting: Nurse Practitioner

## 2022-11-27 ENCOUNTER — Encounter: Payer: Self-pay | Admitting: Nurse Practitioner

## 2022-11-27 VITALS — BP 126/68 | HR 83 | Temp 98.0°F | Resp 16 | Ht 72.0 in | Wt 288.0 lb

## 2022-11-27 DIAGNOSIS — K1120 Sialoadenitis, unspecified: Secondary | ICD-10-CM

## 2022-11-27 MED ORDER — AMOXICILLIN-POT CLAVULANATE 875-125 MG PO TABS: 1.0000 | ORAL_TABLET | Freq: Two times a day (BID) | ORAL | 0 refills | Status: DC

## 2022-11-27 NOTE — Progress Notes (Signed)
Established Patient Office Visit  Subjective:  Patient ID: Connor Byrd, male    DOB: [birth year]  Age: 52 y.o. MRN: [id number]  CC:  Chief Complaint  Patient presents with   Facial Swelling    HPI  Connor Byrd presents for facial swelling for a week and the pain started today.  Swelling and tenderness near parotid gland. Patient states that he had same kind of swelling last year in December and got better with use of antibiotic.  Denies fever, difficulty swallowing or chest pain.   HPI   Past Medical History:  Diagnosis Date   Depression    Insomnia     Past Surgical History:  Procedure Laterality Date   COLONOSCOPY WITH PROPOFOL N/A 01/03/2022   Procedure: COLONOSCOPY WITH PROPOFOL;  Surgeon: Toney Reil, MD;  Location: [facility] ENDOSCOPY;  Service: Gastroenterology;  Laterality: N/A;   NO PAST SURGERIES     WISDOM TOOTH EXTRACTION      Family History  Problem Relation Age of Onset   Alcohol abuse Mother    Arthritis Mother    Mental illness Mother    Hyperlipidemia Father    Hypertension Father    Diabetes Father    Mental illness Maternal Grandmother    Diabetes Maternal Grandfather    Alcohol abuse Paternal Grandfather    Prostate cancer Neg Hx    Bladder Cancer Neg Hx    Kidney cancer Neg Hx     Social History   Socioeconomic History   Marital status: Widowed    Spouse name: Not on file   Number of children: Not on file   Years of education: Not on file   Highest education level: Not on file  Occupational History   Not on file  Tobacco Use   Smoking status: Former    Packs/day: 1.5    Types: Cigarettes    Start date: 03/10/1987    Quit date: 01/03/2016    Years since quitting: 6.9   Smokeless tobacco: Never  Vaping Use   Vaping Use: Never used  Substance and Sexual Activity   Alcohol use: Yes    Alcohol/week: 2.0 - 4.0 standard drinks of alcohol    Types: 2 - 4 Standard drinks or equivalent per week   Drug use: Yes    Types:  Marijuana    Comment: only once last 12/29/2021   Sexual activity: Yes    Partners: Female  Other Topics Concern   Not on file  Social History Narrative   Not on file   Social Determinants of Health   Financial Resource Strain: Not on file  Food Insecurity: Not on file  Transportation Needs: Not on file  Physical Activity: Not on file  Stress: Not on file  Social Connections: Not on file  Intimate Partner Violence: Not on file     Outpatient Medications Prior to Visit  Medication Sig Dispense Refill   azelastine (ASTELIN) 0.1 % nasal spray Place 2 sprays into both nostrils 2 (two) times daily. Use in each nostril as directed 30 mL 5   busPIRone (BUSPAR) 7.5 MG tablet TAKE 1 TABLET BY MOUTH TWICE A DAY 60 tablet 3   escitalopram (LEXAPRO) 20 MG tablet TAKE 1 AND 1/2 TABLET BY MOUTH DAILY 135 tablet 1   hydrOXYzine (ATARAX) 10 MG tablet TAKE ONE TABLET BY MOUTH THREE TIMES A DAY AS NEEDED FOR ANXIETY 30 tablet 0   levocetirizine (XYZAL) 5 MG tablet Take 1 tablet (5 mg total) by mouth  every evening. 30 tablet 5   tadalafil (CIALIS) 5 MG tablet TAKE 1/2 TABLET BY MOUTH DAILY AS NEEDED FOR ERECTILE DYSFUNCTION 45 tablet 1   tirzepatide (ZEPBOUND) 2.5 MG/0.5ML Pen Inject 2.5 mg into the skin once a week. 2 mL 0   valsartan (DIOVAN) 80 MG tablet Take 1 tablet (80 mg total) by mouth daily. 90 tablet 3   zolpidem (AMBIEN) 5 MG tablet Take 1 tablet (5 mg total) by mouth at bedtime as needed for sleep. 30 tablet 0   No facility-administered medications prior to visit.    No Known Allergies  ROS Review of Systems Negative unless indicated in HPI.    Objective:    Physical Exam HENT:     Right Ear: Tympanic membrane normal. Tympanic membrane is not erythematous.     Left Ear: Tympanic membrane normal. Tympanic membrane is not erythematous.     Mouth/Throat:     Mouth: Mucous membranes are moist.     Pharynx: No pharyngeal swelling, oropharyngeal exudate or posterior oropharyngeal  erythema.     Comments: Swelling to the parotid area Cardiovascular:     Rate and Rhythm: Normal rate and regular rhythm.  Pulmonary:     Effort: Pulmonary effort is normal.     Breath sounds: Normal breath sounds. No stridor. No wheezing.  Neurological:     General: No focal deficit present.     Mental Status: He is oriented to person, place, and time. Mental status is at baseline.  Psychiatric:        Mood and Affect: Mood normal.        Behavior: Behavior normal.        Thought Content: Thought content normal.        Judgment: Judgment normal.     BP 126/68   Pulse 83   Temp 98 F (36.7 C)   Resp 16   Ht 6' (1.829 m)   Wt 288 lb (130.6 kg)   SpO2 97%   BMI 39.06 kg/m  Wt Readings from Last 3 Encounters:  11/27/22 288 lb (130.6 kg)  09/08/22 285 lb (129.3 kg)  09/08/22 279 lb (126.6 kg)     Health Maintenance  Topic Date Due   Hepatitis C Screening  Never done   Zoster Vaccines- Shingrix (1 of 2) Never done   COVID-19 Vaccine (4 - 2023-24 season) 02/07/2022   INFLUENZA VACCINE  01/08/2023   Colonoscopy  01/03/2025   DTaP/Tdap/Td (2 - Td or Tdap) 05/28/2027   HIV Screening  Completed   HPV VACCINES  Aged Out    There are no preventive care reminders to display for this patient.  Lab Results  Component Value Date   TSH 0.63 09/08/2022   Lab Results  Component Value Date   WBC 8.9 04/23/2022   HGB 16.1 04/23/2022   HCT 46.6 04/23/2022   MCV 88.9 04/23/2022   PLT 324.0 04/23/2022   Lab Results  Component Value Date   NA 138 09/08/2022   K 4.2 09/08/2022   CO2 26 09/08/2022   GLUCOSE 88 09/08/2022   BUN 8 09/08/2022   CREATININE 0.71 09/08/2022   BILITOT 0.6 04/23/2022   ALKPHOS 62 04/23/2022   AST 18 04/23/2022   ALT 24 04/23/2022   PROT 6.9 04/23/2022   ALBUMIN 4.5 04/23/2022   CALCIUM 9.4 09/08/2022   ANIONGAP 11 05/15/2020   GFR 106.99 09/08/2022   Lab Results  Component Value Date   CHOL 188 04/23/2022   Lab Results  Component  Value Date   HDL 33.10 (L) 04/23/2022   Lab Results  Component Value Date   LDLCALC 134 (H) 04/23/2022   Lab Results  Component Value Date   TRIG 102.0 04/23/2022   Lab Results  Component Value Date   CHOLHDL 6 04/23/2022   Lab Results  Component Value Date   HGBA1C 5.7 04/23/2022      Assessment & Plan:  Parotitis Assessment & Plan: Will treat with Augmentin.  Advised patient to seek emergent care if have difficulty breathing, swallowing or worsening swelling. Referral sent to ENT.  Orders: -     Ambulatory referral to ENT  Other orders -     Amoxicillin-Pot Clavulanate; Take 1 tablet by mouth 2 (two) times daily.  Dispense: 20 tablet; Refill: 0    Follow-up: No follow-ups on file.   Kara Dies, NP

## 2022-12-12 NOTE — Assessment & Plan Note (Signed)
Will treat with Augmentin.  Advised patient to seek emergent care if have difficulty breathing, swallowing or worsening swelling. Referral sent to ENT.

## 2022-12-17 ENCOUNTER — Encounter: Payer: Self-pay | Admitting: Family Medicine

## 2022-12-23 MED ORDER — ZEPBOUND 5 MG/0.5ML ~~LOC~~ SOAJ: 5.0000 mg | SUBCUTANEOUS | 1 refills | Status: DC

## 2023-01-07 ENCOUNTER — Other Ambulatory Visit: Payer: Self-pay | Admitting: Family Medicine

## 2023-01-07 DIAGNOSIS — N529 Male erectile dysfunction, unspecified: Secondary | ICD-10-CM

## 2023-01-19 ENCOUNTER — Encounter: Payer: Self-pay | Admitting: Family Medicine

## 2023-01-19 DIAGNOSIS — K1120 Sialoadenitis, unspecified: Secondary | ICD-10-CM

## 2023-02-19 ENCOUNTER — Other Ambulatory Visit (HOSPITAL_COMMUNITY): Payer: Self-pay

## 2023-03-11 ENCOUNTER — Ambulatory Visit: Payer: No Typology Code available for payment source | Admitting: Family Medicine

## 2023-03-11 ENCOUNTER — Encounter: Payer: Self-pay | Admitting: Family Medicine

## 2023-03-11 VITALS — BP 118/74 | HR 81 | Temp 98.3°F | Ht 72.0 in | Wt 276.2 lb

## 2023-03-11 DIAGNOSIS — E669 Obesity, unspecified: Secondary | ICD-10-CM | POA: Diagnosis not present

## 2023-03-11 DIAGNOSIS — I10 Essential (primary) hypertension: Secondary | ICD-10-CM | POA: Diagnosis not present

## 2023-03-11 DIAGNOSIS — F419 Anxiety disorder, unspecified: Secondary | ICD-10-CM

## 2023-03-11 DIAGNOSIS — Z87891 Personal history of nicotine dependence: Secondary | ICD-10-CM | POA: Diagnosis not present

## 2023-03-11 DIAGNOSIS — F32A Depression, unspecified: Secondary | ICD-10-CM

## 2023-03-11 LAB — COMPREHENSIVE METABOLIC PANEL
ALT: 33 U/L (ref 0–53)
AST: 22 U/L (ref 0–37)
Albumin: 4.6 g/dL (ref 3.5–5.2)
Alkaline Phosphatase: 63 U/L (ref 39–117)
BUN: 10 mg/dL (ref 6–23)
CO2: 27 meq/L (ref 19–32)
Calcium: 9.4 mg/dL (ref 8.4–10.5)
Chloride: 104 meq/L (ref 96–112)
Creatinine, Ser: 0.81 mg/dL (ref 0.40–1.50)
GFR: 102.45 mL/min (ref >60.00)
Glucose, Bld: 92 mg/dL (ref 70–99)
Potassium: 3.9 meq/L (ref 3.5–5.1)
Sodium: 139 meq/L (ref 135–145)
Total Bilirubin: 0.5 mg/dL (ref 0.2–1.2)
Total Protein: 7.2 g/dL (ref 6.0–8.3)

## 2023-03-11 LAB — LIPID PANEL
Cholesterol: 159 mg/dL (ref 0–200)
HDL: 36.6 mg/dL — ABNORMAL LOW (ref >39.00)
LDL Cholesterol: 93 mg/dL (ref 0–99)
NonHDL: 121.92
Total CHOL/HDL Ratio: 4
Triglycerides: 143 mg/dL (ref 0.0–149.0)
VLDL: 28.6 mg/dL (ref 0.0–40.0)

## 2023-03-11 NOTE — Assessment & Plan Note (Signed)
Chronic issue.  Adequately controlled.  He will continue valsartan 80 mg daily.  Check labs.

## 2023-03-11 NOTE — Progress Notes (Signed)
Marikay Alar, MD Phone: [phone]  Connor Byrd is a 52 y.o. male who presents today for f/u.  HYPERTENSION Disease Monitoring Home BP Monitoring not checking consistently Chest pain- no    Dyspnea- no Medications Compliance-  taking valsartan.  Edema- no BMET    Component Value Date/Time   NA 138 09/08/2022 1206   K 4.2 09/08/2022 1206   CL 106 09/08/2022 1206   CO2 26 09/08/2022 1206   GLUCOSE 88 09/08/2022 1206   BUN 8 09/08/2022 1206   CREATININE 0.71 09/08/2022 1206   CALCIUM 9.4 09/08/2022 1206   GFRNONAA >60 05/15/2020 2351   Anxiety/depression: Patient notes no symptoms.  He continues on Lexapro.  No SI.  Obesity: Patient notes he is off Zepbound the last couple of weeks.  It was getting too expensive.  Notes his diet is been hit or miss.  He does have diet soda.  No exercise.  Notes his work schedule is getting in the way of exercising.  He just started a new job.  Social History   Tobacco Use  Smoking Status Former   Current packs/day: 0.00   Average packs/day: 1.5 packs/day for 28.8 years (43.2 ttl pk-yrs)   Types: Cigarettes   Start date: 03/10/1987   Quit date: 01/03/2016   Years since quitting: 7.1  Smokeless Tobacco Never    Current Outpatient Medications on File Prior to Visit  Medication Sig Dispense Refill   azelastine (ASTELIN) 0.1 % nasal spray Place 2 sprays into both nostrils 2 (two) times daily. Use in each nostril as directed 30 mL 5   escitalopram (LEXAPRO) 20 MG tablet TAKE 1 AND 1/2 TABLET BY MOUTH DAILY 135 tablet 1   levocetirizine (XYZAL) 5 MG tablet Take 1 tablet (5 mg total) by mouth every evening. 30 tablet 5   tadalafil (CIALIS) 5 MG tablet TAKE 1/2 TABLET BY MOUTH DAILY AS NEEDED FOR ERECTILE DYSFUNCTION 45 tablet 1   tirzepatide (ZEPBOUND) 5 MG/0.5ML Pen Inject 5 mg into the skin once a week. 6 mL 1   valsartan (DIOVAN) 80 MG tablet Take 1 tablet (80 mg total) by mouth daily. 90 tablet 3   zolpidem (AMBIEN) 5 MG tablet Take  1 tablet (5 mg total) by mouth at bedtime as needed for sleep. 30 tablet 0   No current facility-administered medications on file prior to visit.     ROS see history of present illness  Objective  Physical Exam Vitals:   03/11/23 1138  BP: 118/74  Pulse: 81  Temp: 98.3 F (36.8 C)  SpO2: 96%    BP Readings from Last 3 Encounters:  03/11/23 118/74  11/27/22 126/68  09/08/22 118/74   Wt Readings from Last 3 Encounters:  03/11/23 276 lb 3.2 oz (125.3 kg)  11/27/22 288 lb (130.6 kg)  09/08/22 285 lb (129.3 kg)    Physical Exam Constitutional:      General: He is not in acute distress.    Appearance: He is not diaphoretic.  Cardiovascular:     Rate and Rhythm: Normal rate and regular rhythm.     Heart sounds: Normal heart sounds.  Pulmonary:     Effort: Pulmonary effort is normal.     Breath sounds: Normal breath sounds.  Musculoskeletal:     Right lower leg: No edema.     Left lower leg: No edema.  Skin:    General: Skin is warm and dry.  Neurological:     Mental Status: He is alert.  Assessment/Plan: Please see individual problem list.  Primary hypertension Assessment & Plan: Chronic issue.  Adequately controlled.  He will continue valsartan 80 mg daily.  Check labs.  Orders: -     Protein / creatinine ratio, urine -     Comprehensive metabolic panel -     Lipid panel  Anxiety and depression Assessment & Plan: Chronic issue.  Stable.  He will continue Lexapro 30 mg daily.   Obesity (BMI 30-39.9) Assessment & Plan: Chronic issue.  Weight is down some.  Encouraged healthy diet and adding in exercise.  Patient will remain off of Zepbound at this time due to cost.   Former smoker -     Ambulatory Referral for Lung Cancer Scre    Return in about 6 months (around 09/09/2023).   Marikay Alar, MD [facility]

## 2023-03-11 NOTE — Assessment & Plan Note (Signed)
Chronic issue.  Stable.  He will continue Lexapro 30 mg daily.

## 2023-03-11 NOTE — Assessment & Plan Note (Signed)
Chronic issue.  Weight is down some.  Encouraged healthy diet and adding in exercise.  Patient will remain off of Zepbound at this time due to cost.

## 2023-03-12 LAB — PROTEIN / CREATININE RATIO, URINE
Creatinine, Urine: 166 mg/dL (ref 20–320)
Protein/Creat Ratio: 66 mg/g{creat} (ref 25–148)
Protein/Creatinine Ratio: 0.066 mg/mg{creat} (ref 0.025–0.148)
Total Protein, Urine: 11 mg/dL (ref 5–25)

## 2023-03-24 ENCOUNTER — Encounter: Payer: Self-pay | Admitting: Family Medicine

## 2023-03-27 ENCOUNTER — Ambulatory Visit: Payer: No Typology Code available for payment source | Admitting: Nurse Practitioner

## 2023-04-08 ENCOUNTER — Other Ambulatory Visit: Payer: Self-pay | Admitting: Family Medicine

## 2023-04-08 DIAGNOSIS — F32A Depression, unspecified: Secondary | ICD-10-CM

## 2023-06-22 ENCOUNTER — Encounter: Payer: Self-pay | Admitting: Family Medicine

## 2023-06-22 DIAGNOSIS — N529 Male erectile dysfunction, unspecified: Secondary | ICD-10-CM

## 2023-06-22 MED ORDER — TADALAFIL 5 MG PO TABS
ORAL_TABLET | ORAL | 0 refills | Status: DC
Start: 2023-06-22 — End: 2023-10-20

## 2023-09-09 ENCOUNTER — Encounter: Payer: Self-pay | Admitting: Nurse Practitioner

## 2023-09-09 ENCOUNTER — Ambulatory Visit: Payer: No Typology Code available for payment source | Admitting: Nurse Practitioner

## 2023-09-09 ENCOUNTER — Ambulatory Visit: Payer: No Typology Code available for payment source | Admitting: Family Medicine

## 2023-09-09 VITALS — BP 138/82 | HR 70 | Temp 98.7°F | Ht 72.0 in | Wt 276.0 lb

## 2023-09-09 DIAGNOSIS — G2581 Restless legs syndrome: Secondary | ICD-10-CM | POA: Insufficient documentation

## 2023-09-09 DIAGNOSIS — F32A Depression, unspecified: Secondary | ICD-10-CM

## 2023-09-09 DIAGNOSIS — G4733 Obstructive sleep apnea (adult) (pediatric): Secondary | ICD-10-CM

## 2023-09-09 DIAGNOSIS — R7303 Prediabetes: Secondary | ICD-10-CM

## 2023-09-09 DIAGNOSIS — R7989 Other specified abnormal findings of blood chemistry: Secondary | ICD-10-CM

## 2023-09-09 DIAGNOSIS — I10 Essential (primary) hypertension: Secondary | ICD-10-CM | POA: Diagnosis not present

## 2023-09-09 DIAGNOSIS — F419 Anxiety disorder, unspecified: Secondary | ICD-10-CM | POA: Diagnosis not present

## 2023-09-09 MED ORDER — ROPINIROLE HCL 0.25 MG PO TABS: 0.2500 mg | ORAL_TABLET | Freq: Every day | ORAL | 1 refills | Status: DC

## 2023-09-09 NOTE — Progress Notes (Unsigned)
 Bethanie Dicker, NP-C Phone: [phone]  Connor Byrd is a 53 y.o. male who presents today for transfer of care.   Discussed the use of AI scribe software for clinical note transcription with the patient, who gave verbal consent to proceed.  History of Present Illness   Connor MCDIARMID "Kathlene November" is a 53 year old male with restless leg syndrome who presents with worsening symptoms affecting sleep and for transfer of care visit.  He has been experiencing symptoms of restless leg syndrome for the past year to year and a half, with a notable worsening over the past several months. The sensation is described as 'very tingly' and, while not painful, is disruptive enough to keep him and his fiance awake at night due to involuntary leg movements. He has not experienced cramping. He has not been on any prescribed medication for this condition but mentions that 'pot gummies' provide some relief.  He has a history of sleep apnea and uses a CPAP machine most nights, which he feels is effective in helping him feel well-rested. Initially, he had difficulty adjusting to the CPAP due to past trauma related to his late wife's hospitalization, during which he was prescribed Ambien to aid in acclimatization. He no longer uses Ambien.  He has a history of hypertension and takes valsartan daily. He does not regularly check his blood pressure at home but acknowledges having the equipment to do so. He reports an increased intake of coffee due to poor sleep and current stressors, including house renovations and frequent travel between cities.  In terms of lifestyle, he reports improved diet and exercise habits since moving in with his fiance, who is active in yoga and boxing. 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 No chest pain, shortness of breath, dizziness, or swelling.      Social History   Tobacco Use  Smoking Status Former   Current packs/day: 0.00    Average packs/day: 1.5 packs/day for 28.8 years (43.2 ttl pk-yrs)   Types: Cigarettes   Start date: 03/10/1987   Quit date: 01/03/2016   Years since quitting: 7.6  Smokeless Tobacco Never    Current Outpatient Medications on File Prior to Visit  Medication Sig Dispense Refill   escitalopram (LEXAPRO) 20 MG tablet TAKE 1 AND 1/2 TABLET BY MOUTH DAILY 135 tablet 1   tadalafil (CIALIS) 5 MG tablet TAKE 1/2 TABLET BY MOUTH DAILY AS NEEDED FOR ERECTILE DYSFUNCTION 45 tablet 0   valsartan (DIOVAN) 80 MG tablet TAKE 1 TABLET BY MOUTH DAILY 90 tablet 3   No current facility-administered medications on file prior to visit.     ROS see history of present illness  Objective  Physical Exam Vitals:   09/09/23 0905 09/09/23 0919  BP: (!) 144/88 138/82  Pulse: 70   Temp: 98.7 F (37.1 C)   SpO2: 95%     BP Readings from Last 3 Encounters:  09/09/23 138/82  03/11/23 118/74  11/27/22 126/68   Wt Readings from Last 3 Encounters:  09/09/23 276 lb (125.2 kg)  03/11/23 276 lb 3.2 oz (125.3 kg)  11/27/22 288 lb (130.6 kg)    Physical Exam Constitutional:      General: He is not in acute distress.    Appearance: Normal appearance.  HENT:     Head: Normocephalic.  Cardiovascular:     Rate and Rhythm: Normal rate and regular rhythm.     Heart sounds: Normal heart sounds.  Pulmonary:  Effort: Pulmonary effort is normal.     Breath sounds: Normal breath sounds.  Skin:    General: Skin is warm and dry.  Neurological:     General: No focal deficit present.     Mental Status: He is alert.  Psychiatric:        Mood and Affect: Mood normal.        Behavior: Behavior normal.     Assessment/Plan: Please see individual problem list.  Restless legs Assessment & Plan: Symptoms persist with worsening tingling and involuntary movements affecting sleep. Cannabis gummies provide some relief. Start Requip to improve symptoms and sleep quality. Order blood work to assess underlying  causes.   Orders: -     rOPINIRole HCl; Take 1 tablet (0.25 mg total) by mouth at bedtime. X 7 days, then can increase to 0.5 mg daily at bedtime if needed.  Dispense: 90 tablet; Refill: 1 -     Vitamin B12; Future -     IBC + Ferritin; Future -     VITAMIN D 25 Hydroxy (Vit-D Deficiency, Fractures); Future -     Magnesium; Future  Primary hypertension Assessment & Plan: Managed with valsartan 80 mg daily. Elevated x 1 reading today, improvement on second reading. Usually stable. Increased coffee intake may affect blood pressure. Encourage home blood pressure monitoring after resting. Continue valsartan as prescribed.  Orders: -     CBC with Differential/Platelet; Future -     Comprehensive metabolic panel with GFR; Future  OSA (obstructive sleep apnea) Assessment & Plan: Managed with CPAP, reports feeling well-rested, indicating effective management. Continue CPAP use as prescribed.    Anxiety and depression Assessment & Plan: Mood is well controlled on Lexapro 30 mg daily. Continue. Encouraged to contact if worsening symptoms, unusual behavior changes or suicidal thoughts occur.   Orders: -     TSH; Future  Prediabetes Assessment & Plan: We will check A1c. Encourage healthy diet and regular exercise.   Orders: -     Hemoglobin A1c; Future  High serum low-density lipoprotein (LDL) Assessment & Plan: The 10-year ASCVD risk score (Arnett DK, et al., 2019) is: 5.1%. He will return for fasting lipid panel. Encourage healthy diet and regular exercise. We will discuss initiating statin if cholesterol is more elevated.   Orders: -     Lipid panel; Future   Return in about 6 months (around 03/10/2024) for Follow up.   Bethanie Dicker, NP-C [facility]

## 2023-09-10 ENCOUNTER — Other Ambulatory Visit (INDEPENDENT_AMBULATORY_CARE_PROVIDER_SITE_OTHER)

## 2023-09-10 ENCOUNTER — Other Ambulatory Visit: Payer: Self-pay | Admitting: Nurse Practitioner

## 2023-09-10 ENCOUNTER — Encounter: Payer: Self-pay | Admitting: Nurse Practitioner

## 2023-09-10 DIAGNOSIS — G2581 Restless legs syndrome: Secondary | ICD-10-CM

## 2023-09-10 DIAGNOSIS — R7303 Prediabetes: Secondary | ICD-10-CM

## 2023-09-10 DIAGNOSIS — E538 Deficiency of other specified B group vitamins: Secondary | ICD-10-CM | POA: Insufficient documentation

## 2023-09-10 DIAGNOSIS — R7989 Other specified abnormal findings of blood chemistry: Secondary | ICD-10-CM

## 2023-09-10 DIAGNOSIS — I10 Essential (primary) hypertension: Secondary | ICD-10-CM

## 2023-09-10 DIAGNOSIS — F419 Anxiety disorder, unspecified: Secondary | ICD-10-CM

## 2023-09-10 DIAGNOSIS — F32A Depression, unspecified: Secondary | ICD-10-CM | POA: Diagnosis not present

## 2023-09-10 DIAGNOSIS — E559 Vitamin D deficiency, unspecified: Secondary | ICD-10-CM | POA: Insufficient documentation

## 2023-09-10 LAB — CBC WITH DIFFERENTIAL/PLATELET
Basophils Absolute: 0 10*3/uL (ref 0.0–0.1)
Basophils Relative: 0.6 % (ref 0.0–3.0)
Eosinophils Absolute: 0.1 10*3/uL (ref 0.0–0.7)
Eosinophils Relative: 2.1 % (ref 0.0–5.0)
HCT: 45.5 % (ref 39.0–52.0)
Hemoglobin: 15.1 g/dL (ref 13.0–17.0)
Lymphocytes Relative: 28.2 % (ref 12.0–46.0)
Lymphs Abs: 1.7 10*3/uL (ref 0.7–4.0)
MCHC: 33.3 g/dL (ref 30.0–36.0)
MCV: 90.4 fl (ref 78.0–100.0)
Monocytes Absolute: 0.4 10*3/uL (ref 0.1–1.0)
Monocytes Relative: 7 % (ref 3.0–12.0)
Neutro Abs: 3.8 10*3/uL (ref 1.4–7.7)
Neutrophils Relative %: 62.1 % (ref 43.0–77.0)
Platelets: 236 10*3/uL (ref 150.0–400.0)
RBC: 5.04 Mil/uL (ref 4.22–5.81)
RDW: 12.5 % (ref 11.5–15.5)
WBC: 6.1 10*3/uL (ref 4.0–10.5)

## 2023-09-10 LAB — LIPID PANEL
Cholesterol: 168 mg/dL (ref 0–200)
HDL: 36.9 mg/dL — ABNORMAL LOW (ref >39.00)
LDL Cholesterol: 111 mg/dL — ABNORMAL HIGH (ref 0–99)
NonHDL: 130.83
Total CHOL/HDL Ratio: 5
Triglycerides: 100 mg/dL (ref 0.0–149.0)
VLDL: 20 mg/dL (ref 0.0–40.0)

## 2023-09-10 LAB — IBC + FERRITIN
Ferritin: 215.7 ng/mL (ref 22.0–322.0)
Iron: 120 ug/dL (ref 42–165)
Saturation Ratios: 39.7 % (ref 20.0–50.0)
TIBC: 302.4 ug/dL (ref 250.0–450.0)
Transferrin: 216 mg/dL (ref 212.0–360.0)

## 2023-09-10 LAB — COMPREHENSIVE METABOLIC PANEL WITH GFR
ALT: 33 U/L (ref 0–53)
AST: 22 U/L (ref 0–37)
Albumin: 4.8 g/dL (ref 3.5–5.2)
Alkaline Phosphatase: 53 U/L (ref 39–117)
BUN: 14 mg/dL (ref 6–23)
CO2: 31 meq/L (ref 19–32)
Calcium: 9.5 mg/dL (ref 8.4–10.5)
Chloride: 101 meq/L (ref 96–112)
Creatinine, Ser: 0.89 mg/dL (ref 0.40–1.50)
GFR: 99.23 mL/min (ref >60.00)
Glucose, Bld: 85 mg/dL (ref 70–99)
Potassium: 4.1 meq/L (ref 3.5–5.1)
Sodium: 139 meq/L (ref 135–145)
Total Bilirubin: 0.7 mg/dL (ref 0.2–1.2)
Total Protein: 6.9 g/dL (ref 6.0–8.3)

## 2023-09-10 LAB — VITAMIN D 25 HYDROXY (VIT D DEFICIENCY, FRACTURES): VITD: 16.89 ng/mL — ABNORMAL LOW (ref 30.00–100.00)

## 2023-09-10 LAB — TSH: TSH: 1.25 u[IU]/mL (ref 0.35–5.50)

## 2023-09-10 LAB — VITAMIN B12: Vitamin B-12: 134 pg/mL — ABNORMAL LOW (ref 211–911)

## 2023-09-10 LAB — HEMOGLOBIN A1C: Hgb A1c MFr Bld: 5.6 % (ref 4.6–6.5)

## 2023-09-10 LAB — MAGNESIUM: Magnesium: 2.3 mg/dL (ref 1.5–2.5)

## 2023-09-10 MED ORDER — VITAMIN D (ERGOCALCIFEROL) 1.25 MG (50000 UNIT) PO CAPS: 50000.0000 [IU] | ORAL_CAPSULE | ORAL | 1 refills | Status: DC

## 2023-09-10 MED ORDER — VITAMIN B-12 1000 MCG PO TABS: 1000.0000 ug | ORAL_TABLET | Freq: Every day | ORAL | 3 refills | Status: AC

## 2023-09-10 NOTE — Assessment & Plan Note (Signed)
 Symptoms persist with worsening tingling and involuntary movements affecting sleep. Cannabis gummies provide some relief. Start Requip to improve symptoms and sleep quality. Order blood work to assess underlying causes.

## 2023-09-10 NOTE — Assessment & Plan Note (Signed)
 Managed with CPAP, reports feeling well-rested, indicating effective management. Continue CPAP use as prescribed.

## 2023-09-10 NOTE — Assessment & Plan Note (Signed)
 The 10-year ASCVD risk score (Arnett DK, et al., 2019) is: 5.1%. He will return for fasting lipid panel. Encourage healthy diet and regular exercise. We will discuss initiating statin if cholesterol is more elevated.

## 2023-09-10 NOTE — Assessment & Plan Note (Signed)
 Mood is well controlled on Lexapro 30 mg daily. Continue. Encouraged to contact if worsening symptoms, unusual behavior changes or suicidal thoughts occur.

## 2023-09-10 NOTE — Assessment & Plan Note (Signed)
 We will check A1c. Encourage healthy diet and regular exercise.

## 2023-09-10 NOTE — Assessment & Plan Note (Signed)
 Managed with valsartan 80 mg daily. Elevated x 1 reading today, improvement on second reading. Usually stable. Increased coffee intake may affect blood pressure. Encourage home blood pressure monitoring after resting. Continue valsartan as prescribed.

## 2023-10-07 ENCOUNTER — Other Ambulatory Visit: Payer: Self-pay

## 2023-10-07 DIAGNOSIS — F32A Depression, unspecified: Secondary | ICD-10-CM

## 2023-10-07 MED ORDER — ESCITALOPRAM OXALATE 20 MG PO TABS
30.0000 mg | ORAL_TABLET | Freq: Every day | ORAL | 1 refills | Status: DC
Start: 2023-10-07 — End: 2024-04-06

## 2023-10-20 ENCOUNTER — Other Ambulatory Visit: Payer: Self-pay

## 2023-10-20 DIAGNOSIS — N529 Male erectile dysfunction, unspecified: Secondary | ICD-10-CM

## 2023-10-20 MED ORDER — TADALAFIL 5 MG PO TABS: ORAL_TABLET | ORAL | 1 refills | Status: DC

## 2023-11-09 ENCOUNTER — Ambulatory Visit (INDEPENDENT_AMBULATORY_CARE_PROVIDER_SITE_OTHER)

## 2023-11-09 DIAGNOSIS — E538 Deficiency of other specified B group vitamins: Secondary | ICD-10-CM

## 2023-11-09 MED ORDER — CYANOCOBALAMIN 1000 MCG/ML IJ SOLN
1000.0000 ug | Freq: Once | INTRAMUSCULAR | Status: AC
  Administered 2023-11-09: 1000 ug via INTRAMUSCULAR

## 2023-11-09 NOTE — Progress Notes (Signed)
 Pt presented for their vitamin B12 injection. Pt was identified through two identifiers. Pt tolerated shot well in their right deltoid.

## 2023-11-16 ENCOUNTER — Ambulatory Visit

## 2023-11-23 ENCOUNTER — Ambulatory Visit (INDEPENDENT_AMBULATORY_CARE_PROVIDER_SITE_OTHER)

## 2023-11-23 DIAGNOSIS — E538 Deficiency of other specified B group vitamins: Secondary | ICD-10-CM | POA: Diagnosis not present

## 2023-11-23 MED ORDER — CYANOCOBALAMIN 1000 MCG/ML IJ SOLN
1000.0000 ug | Freq: Once | INTRAMUSCULAR | Status: AC
  Administered 2023-11-23: 1000 ug via INTRAMUSCULAR

## 2023-11-23 NOTE — Progress Notes (Signed)
 Patient presented for B 12 injection to left deltoid, patient voiced no concerns nor showed any signs of distress during injection.

## 2023-11-30 ENCOUNTER — Ambulatory Visit

## 2023-12-06 ENCOUNTER — Other Ambulatory Visit: Payer: Self-pay | Admitting: Nurse Practitioner

## 2023-12-06 DIAGNOSIS — G2581 Restless legs syndrome: Secondary | ICD-10-CM

## 2023-12-23 ENCOUNTER — Telehealth: Payer: Self-pay | Admitting: Nurse Practitioner

## 2023-12-23 NOTE — Telephone Encounter (Signed)
 Left message that patient's appointment time has changed from 8:40 am to 9:40 am on October 2nd.  E2C2 please advise.

## 2024-03-10 ENCOUNTER — Ambulatory Visit: Admitting: Nurse Practitioner

## 2024-04-02 ENCOUNTER — Other Ambulatory Visit: Payer: Self-pay | Admitting: Nurse Practitioner

## 2024-04-02 DIAGNOSIS — F32A Depression, unspecified: Secondary | ICD-10-CM

## 2024-04-04 ENCOUNTER — Other Ambulatory Visit: Payer: Self-pay

## 2024-04-04 MED ORDER — VALSARTAN 80 MG PO TABS: 80.0000 mg | ORAL_TABLET | Freq: Every day | ORAL | 3 refills | Status: DC

## 2024-04-08 ENCOUNTER — Ambulatory Visit: Admitting: Nurse Practitioner

## 2024-04-08 ENCOUNTER — Encounter: Payer: Self-pay | Admitting: Nurse Practitioner

## 2024-04-08 ENCOUNTER — Ambulatory Visit (INDEPENDENT_AMBULATORY_CARE_PROVIDER_SITE_OTHER)

## 2024-04-08 VITALS — BP 118/76 | HR 66 | Temp 98.3°F | Ht 72.0 in | Wt 266.8 lb

## 2024-04-08 DIAGNOSIS — F419 Anxiety disorder, unspecified: Secondary | ICD-10-CM | POA: Diagnosis not present

## 2024-04-08 DIAGNOSIS — R7989 Other specified abnormal findings of blood chemistry: Secondary | ICD-10-CM

## 2024-04-08 DIAGNOSIS — G2581 Restless legs syndrome: Secondary | ICD-10-CM | POA: Diagnosis not present

## 2024-04-08 DIAGNOSIS — E559 Vitamin D deficiency, unspecified: Secondary | ICD-10-CM

## 2024-04-08 DIAGNOSIS — Z23 Encounter for immunization: Secondary | ICD-10-CM | POA: Diagnosis not present

## 2024-04-08 DIAGNOSIS — N529 Male erectile dysfunction, unspecified: Secondary | ICD-10-CM

## 2024-04-08 DIAGNOSIS — E538 Deficiency of other specified B group vitamins: Secondary | ICD-10-CM | POA: Diagnosis not present

## 2024-04-08 DIAGNOSIS — I10 Essential (primary) hypertension: Secondary | ICD-10-CM

## 2024-04-08 DIAGNOSIS — G8929 Other chronic pain: Secondary | ICD-10-CM

## 2024-04-08 DIAGNOSIS — G4733 Obstructive sleep apnea (adult) (pediatric): Secondary | ICD-10-CM

## 2024-04-08 DIAGNOSIS — M79642 Pain in left hand: Secondary | ICD-10-CM

## 2024-04-08 DIAGNOSIS — F32A Depression, unspecified: Secondary | ICD-10-CM

## 2024-04-08 LAB — VITAMIN D 25 HYDROXY (VIT D DEFICIENCY, FRACTURES): VITD: 16.77 ng/mL — ABNORMAL LOW (ref 30.00–100.00)

## 2024-04-08 LAB — TESTOSTERONE: Testosterone: 226.58 ng/dL — ABNORMAL LOW (ref 300.00–890.00)

## 2024-04-08 LAB — VITAMIN B12: Vitamin B-12: 1023 pg/mL — ABNORMAL HIGH (ref 211–911)

## 2024-04-08 MED ORDER — TADALAFIL 5 MG PO TABS: ORAL_TABLET | ORAL | 3 refills | Status: AC

## 2024-04-08 MED ORDER — VALSARTAN 80 MG PO TABS: 80.0000 mg | ORAL_TABLET | Freq: Every day | ORAL | 3 refills | Status: AC

## 2024-04-08 MED ORDER — MELOXICAM 7.5 MG PO TABS: 7.5000 mg | ORAL_TABLET | Freq: Two times a day (BID) | ORAL | 0 refills | Status: DC | PRN

## 2024-04-08 MED ORDER — ROPINIROLE HCL 0.5 MG PO TABS: 0.5000 mg | ORAL_TABLET | Freq: Every day | ORAL | 0 refills | Status: DC

## 2024-04-08 MED ORDER — ESCITALOPRAM OXALATE 20 MG PO TABS: 30.0000 mg | ORAL_TABLET | Freq: Every day | ORAL | 3 refills | Status: AC

## 2024-04-08 NOTE — Progress Notes (Signed)
 Connor Glance, Connor Byrd Phone: [phone]  Connor Byrd is a 53 y.o. male who presents today for follow up.   Discussed the use of AI scribe software for clinical note transcription with the patient, who gave verbal consent to proceed.  History of Present Illness   Connor Byrd is a 53 year old male with restless leg syndrome who presents with persistent symptoms despite treatment.  He experiences persistent symptoms of restless leg syndrome, which have improved but not resolved. Symptoms previously began around 6 or 7 PM, but now start at bedtime. Despite this improvement, he and his partner cannot sleep in the same bed due to the severity of his movements, described as 'like aerobics while I'm sleeping.' He previously tried Requip  for a week without noticing any difference and is currently taking oral B12, which has not fully alleviated the symptoms.  He is currently taking valsartan  daily for blood pressure management. No chest pain, shortness of breath, dizziness, or swelling. He does not regularly check his blood pressure.  He is also on Lexapro , taking one and a half tablets, and reports that his mood, anxiety, and depression are well-controlled. No thoughts of self-harm or harm to others.  He uses a CPAP machine regularly and is taking weekly vitamin D  supplements, which he has recently completed.  He experiences pain in front of his knuckles on three fingers of one hand, which he associates with playing guitar. The pain is described as achy and dull, sometimes causing the fingers to lock up. He has tried ibuprofen without significant relief. No issues with strength or grip and no swelling or sharp pain.  He has been taking oral B12 supplements but has not noticed any significant improvement in energy levels or other symptoms.      Social History   Tobacco Use  Smoking Status Former   Current packs/day: 0.00   Average packs/day: 1.5 packs/day for 28.8 years (43.2 ttl  pk-yrs)   Types: Cigarettes   Start date: 03/10/1987   Quit date: 01/03/2016   Years since quitting: 8.3  Smokeless Tobacco Never    Current Outpatient Medications on File Prior to Visit  Medication Sig Dispense Refill   cyanocobalamin  (VITAMIN B12) 1000 MCG tablet Take 1 tablet (1,000 mcg total) by mouth daily. 90 tablet 3   No current facility-administered medications on file prior to visit.     ROS see history of present illness  Objective  Physical Exam Vitals:   04/08/24 0815  BP: 118/76  Pulse: 66  Temp: 98.3 F (36.8 C)  SpO2: 97%    BP Readings from Last 3 Encounters:  04/08/24 118/76  09/09/23 138/82  03/11/23 118/74   Wt Readings from Last 3 Encounters:  04/08/24 266 lb 12.8 oz (121 kg)  09/09/23 276 lb (125.2 kg)  03/11/23 276 lb 3.2 oz (125.3 kg)    Physical Exam Constitutional:      General: He is not in acute distress.    Appearance: Normal appearance.  HENT:     Head: Normocephalic.  Cardiovascular:     Rate and Rhythm: Normal rate and regular rhythm.     Heart sounds: Normal heart sounds.  Pulmonary:     Effort: Pulmonary effort is normal.     Breath sounds: Normal breath sounds.  Skin:    General: Skin is warm and dry.  Neurological:     General: No focal deficit present.     Mental Status: He is alert.  Psychiatric:  Mood and Affect: Mood normal.        Behavior: Behavior normal.      Assessment/Plan: Please see individual problem list.  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 There is no swelling or strength loss. Trigger finger and possible orthopedic intervention were discussed. Order an x-ray of the left hand. Prescribe meloxicam 7.5 mg, up to twice daily as needed. Consider referral to orthopedics if symptoms persist or are worsening.   Orders: -     DG Hand Complete Left; Future -     Meloxicam; Take 1 tablet (7.5 mg total) by  mouth 2 (two) times daily as needed for pain.  Dispense: 180 tablet; Refill: 0  Restless legs Assessment & Plan: Symptoms have improved but remain unresolved. Requip  trial was ineffective due to stopping after one week, and B12 alone is insufficient. Restart Requip , starting at 0.25 mg, increasing to 0.5 mg, and titrate to 1 mg as needed. Continue oral B12 supplementation. We will continue to monitor.   Orders: -     rOPINIRole  HCl; Take 1 tablet (0.5 mg total) by mouth at bedtime. X 7 days then can increase to 2 tablets at bedtime if needed.  Dispense: 180 tablet; Refill: 0  Primary hypertension Assessment & Plan: Blood pressure is well-controlled on valsartan  80 mg daily. Continue valsartan .   Orders: -     Valsartan ; Take 1 tablet (80 mg total) by mouth daily.  Dispense: 90 tablet; Refill: 3  Anxiety and depression Assessment & Plan: Mood is well controlled on Lexapro  30 mg daily. Continue. Encouraged to contact if worsening symptoms, unusual behavior changes or suicidal thoughts occur.   Orders: -     Escitalopram  Oxalate; Take 1.5 tablets (30 mg total) by mouth daily.  Dispense: 135 tablet; Refill: 3  B12 deficiency Assessment & Plan: Treated with oral supplementation, but there is no significant energy improvement. Potential malabsorption was discussed. Check serum B12 levels.  Orders: -     Vitamin B12  Vitamin D  deficiency Assessment & Plan: Completed weekly high dose vitamin D  supplementation. Not taking any supplementation currently. We will check vitamin D  level today and consider restarting high dose or maintain with OTC daily dosing pending results.   Orders: -     VITAMIN D  25 Hydroxy (Vit-D Deficiency, Fractures)  OSA (obstructive sleep apnea) Assessment & Plan: Managed with CPAP, reports feeling well-rested, indicating effective management. Continue nightly CPAP use.   Erectile dysfunction, unspecified erectile dysfunction type Assessment & Plan: Continue  Cialis  as needed.   Orders: -     Tadalafil ; TAKE 1/2 TABLET BY MOUTH DAILY AS NEEDED FOR ERECTILE DYSFUNCTION  Dispense: 45 tablet; Refill: 3  Low testosterone  in male -     Testosterone   Need for influenza vaccination -     Flu vaccine trivalent PF, 6mos and older(Flulaval,Afluria,Fluarix,Fluzone)     Return in about 3 months (around 07/09/2024) for Follow up.   Connor Glance, Connor Byrd [facility]

## 2024-04-12 ENCOUNTER — Ambulatory Visit: Payer: Self-pay | Admitting: Nurse Practitioner

## 2024-04-12 DIAGNOSIS — E559 Vitamin D deficiency, unspecified: Secondary | ICD-10-CM

## 2024-04-13 MED ORDER — VITAMIN D (ERGOCALCIFEROL) 1.25 MG (50000 UNIT) PO CAPS: 50000.0000 [IU] | ORAL_CAPSULE | ORAL | 1 refills | Status: AC

## 2024-04-15 ENCOUNTER — Other Ambulatory Visit: Payer: Self-pay | Admitting: Nurse Practitioner

## 2024-04-15 DIAGNOSIS — R7989 Other specified abnormal findings of blood chemistry: Secondary | ICD-10-CM

## 2024-04-20 ENCOUNTER — Encounter: Payer: Self-pay | Admitting: Nurse Practitioner

## 2024-04-20 NOTE — Assessment & Plan Note (Signed)
 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 There is no swelling or strength loss. Trigger finger and possible orthopedic intervention were discussed. Order an x-ray of the left hand. Prescribe meloxicam 7.5 mg, up to twice daily as needed. Consider referral to orthopedics if symptoms persist or are worsening.

## 2024-04-20 NOTE — Assessment & Plan Note (Signed)
 Mood is well controlled on Lexapro 30 mg daily. Continue. Encouraged to contact if worsening symptoms, unusual behavior changes or suicidal thoughts occur.

## 2024-04-20 NOTE — Assessment & Plan Note (Signed)
 Symptoms have improved but remain unresolved. Requip  trial was ineffective due to stopping after one week, and B12 alone is insufficient. Restart Requip , starting at 0.25 mg, increasing to 0.5 mg, and titrate to 1 mg as needed. Continue oral B12 supplementation. We will continue to monitor.

## 2024-04-20 NOTE — Assessment & Plan Note (Signed)
 Managed with CPAP, reports feeling well-rested, indicating effective management. Continue nightly CPAP use.

## 2024-04-20 NOTE — Assessment & Plan Note (Signed)
 Treated with oral supplementation, but there is no significant energy improvement. Potential malabsorption was discussed. Check serum B12 levels.

## 2024-04-20 NOTE — Assessment & Plan Note (Signed)
 Blood pressure is well-controlled on valsartan  80 mg daily. Continue valsartan .

## 2024-04-20 NOTE — Assessment & Plan Note (Signed)
 Completed weekly high dose vitamin D  supplementation. Not taking any supplementation currently. We will check vitamin D  level today and consider restarting high dose or maintain with OTC daily dosing pending results.

## 2024-04-20 NOTE — Assessment & Plan Note (Signed)
Continue Cialis as needed 

## 2024-06-10 ENCOUNTER — Encounter: Payer: Self-pay | Admitting: Nurse Practitioner

## 2024-06-10 ENCOUNTER — Other Ambulatory Visit: Payer: Self-pay | Admitting: Family

## 2024-06-10 DIAGNOSIS — G2581 Restless legs syndrome: Secondary | ICD-10-CM

## 2024-06-10 MED ORDER — ROPINIROLE HCL 2 MG PO TABS: 2.0000 mg | ORAL_TABLET | Freq: Every day | ORAL | 1 refills | Status: DC

## 2024-06-27 ENCOUNTER — Other Ambulatory Visit: Payer: Self-pay | Admitting: Nurse Practitioner

## 2024-06-27 DIAGNOSIS — G8929 Other chronic pain: Secondary | ICD-10-CM

## 2024-07-03 ENCOUNTER — Other Ambulatory Visit: Payer: Self-pay | Admitting: Nurse Practitioner

## 2024-07-03 DIAGNOSIS — G2581 Restless legs syndrome: Secondary | ICD-10-CM

## 2024-07-15 ENCOUNTER — Ambulatory Visit: Admitting: Nurse Practitioner

## 2024-07-15 ENCOUNTER — Ambulatory Visit: Payer: Self-pay | Admitting: Nurse Practitioner

## 2024-07-15 ENCOUNTER — Encounter: Payer: Self-pay | Admitting: Nurse Practitioner

## 2024-07-15 VITALS — BP 120/70 | HR 81 | Temp 98.7°F | Ht 72.0 in | Wt 273.8 lb

## 2024-07-15 DIAGNOSIS — I10 Essential (primary) hypertension: Secondary | ICD-10-CM

## 2024-07-15 DIAGNOSIS — R7303 Prediabetes: Secondary | ICD-10-CM

## 2024-07-15 DIAGNOSIS — G8929 Other chronic pain: Secondary | ICD-10-CM

## 2024-07-15 DIAGNOSIS — G2581 Restless legs syndrome: Secondary | ICD-10-CM

## 2024-07-15 DIAGNOSIS — R7989 Other specified abnormal findings of blood chemistry: Secondary | ICD-10-CM

## 2024-07-15 LAB — COMPREHENSIVE METABOLIC PANEL WITH GFR
ALT: 31 U/L (ref 3–53)
AST: 23 U/L (ref 5–37)
Albumin: 4.5 g/dL (ref 3.5–5.2)
Alkaline Phosphatase: 50 U/L (ref 39–117)
BUN: 9 mg/dL (ref 6–23)
CO2: 28 meq/L (ref 19–32)
Calcium: 9.3 mg/dL (ref 8.4–10.5)
Chloride: 105 meq/L (ref 96–112)
Creatinine, Ser: 0.72 mg/dL (ref 0.40–1.50)
GFR: 105.16 mL/min
Glucose, Bld: 87 mg/dL (ref 70–99)
Potassium: 4.2 meq/L (ref 3.5–5.1)
Sodium: 140 meq/L (ref 135–145)
Total Bilirubin: 0.5 mg/dL (ref 0.2–1.2)
Total Protein: 6.6 g/dL (ref 6.0–8.3)

## 2024-07-15 LAB — LIPID PANEL
Cholesterol: 164 mg/dL (ref 28–200)
HDL: 35.1 mg/dL — ABNORMAL LOW
LDL Cholesterol: 105 mg/dL — ABNORMAL HIGH (ref 10–99)
NonHDL: 128.54
Total CHOL/HDL Ratio: 5
Triglycerides: 117 mg/dL (ref 10.0–149.0)
VLDL: 23.4 mg/dL (ref 0.0–40.0)

## 2024-07-15 LAB — HEMOGLOBIN A1C: Hgb A1c MFr Bld: 5.5 % (ref 4.6–6.5)

## 2024-07-15 MED ORDER — ROPINIROLE HCL 4 MG PO TABS: 4.0000 mg | ORAL_TABLET | Freq: Every day | ORAL | 1 refills | Status: AC

## 2024-07-15 NOTE — Assessment & Plan Note (Signed)
 Managed with dietary modifications. Encourage regular exercise. Check lipid panel.

## 2024-07-15 NOTE — Assessment & Plan Note (Addendum)
 Managed with dietary modifications. Continue dietary modifications to reduce sugar intake. Encourage regular exercise. Check A1c.

## 2024-07-15 NOTE — Assessment & Plan Note (Signed)
 Symptoms have improved with meloxicam , allowing him to resume guitar playing. Continue meloxicam  once daily. Check CMP.

## 2024-07-15 NOTE — Assessment & Plan Note (Signed)
 Symptoms are manageable with ropinirole  and reduced sugar intake. Increased ropinirole  to one 4 mg tablet at bedtime and will monitor sugar intake as a trigger.

## 2024-07-15 NOTE — Assessment & Plan Note (Signed)
 Blood pressure is well-controlled on valsartan  80 mg daily. Continue valsartan .

## 2024-07-15 NOTE — Progress Notes (Signed)
 " Leron Glance, NP-C Phone: [phone]  Discussed the use of AI scribe software for clinical note transcription with the patient, who gave verbal consent to proceed.  History of Present Illness   Connor Byrd is a 54 year old male who presents for a follow-up visit.  He has experienced significant improvement in his hand arthritis symptoms since starting meloxicam , allowing him to resume playing the guitar, an activity he hasn't done in ten years. He is currently taking meloxicam  once daily.  His restless leg syndrome symptoms worsened around the holidays, which he associates with increased sugar intake. After reducing sugar consumption and avoiding sweets, symptoms improved. He experienced a period of no leg symptoms for a week and a half following a brief illness that involved vomiting and not eating. Currently, symptoms are manageable, rated as a 'three instead of a seven or a ten'. He is taking ropinirole , specifically two of the 2 mg tablets, totaling 4 mg at bedtime.  He has a history of high cholesterol and prediabetes, which he is managing with dietary changes, particularly by reducing sugar intake. He is also on valsartan  for blood pressure management. No chest pain, shortness of breath, or dizziness.      Tobacco Use History[1]  Medications Ordered Prior to Encounter[2]   ROS see history of present illness  Objective  Physical Exam Vitals:   07/15/24 0836  BP: 120/70  Pulse: 81  Temp: 98.7 F (37.1 C)  SpO2: 96%    BP Readings from Last 3 Encounters:  07/15/24 120/70  04/08/24 118/76  09/09/23 138/82   Wt Readings from Last 3 Encounters:  07/15/24 273 lb 12.8 oz (124.2 kg)  04/08/24 266 lb 12.8 oz (121 kg)  09/09/23 276 lb (125.2 kg)    Physical Exam Constitutional:      General: He is not in acute distress.    Appearance: Normal appearance.  HENT:     Head: Normocephalic.  Cardiovascular:     Rate and Rhythm: Normal rate and regular rhythm.      Heart sounds: Normal heart sounds.  Pulmonary:     Effort: Pulmonary effort is normal.     Breath sounds: Normal breath sounds.  Skin:    General: Skin is warm and dry.  Neurological:     General: No focal deficit present.     Mental Status: He is alert.  Psychiatric:        Mood and Affect: Mood normal.        Behavior: Behavior normal.      Assessment/Plan: Please see individual problem list.  Restless legs Assessment & Plan: Symptoms are manageable with ropinirole  and reduced sugar intake. Increased ropinirole  to one 4 mg tablet at bedtime and will monitor sugar intake as a trigger.   Orders: -     rOPINIRole  HCl; Take 1 tablet (4 mg total) by mouth at bedtime.  Dispense: 90 tablet; Refill: 1  Chronic hand pain, left Assessment & Plan: Symptoms have improved with meloxicam , allowing him to resume guitar playing. Continue meloxicam  once daily. Check CMP.    Prediabetes Assessment & Plan: Managed with dietary modifications. Continue dietary modifications to reduce sugar intake. Encourage regular exercise. Check A1c.   Orders: -     Hemoglobin A1c  High serum low-density lipoprotein (LDL) Assessment & Plan: Managed with dietary modifications. Encourage regular exercise. Check lipid panel.   Orders: -     Comprehensive metabolic panel with GFR -     Lipid panel  Primary  hypertension Assessment & Plan: Blood pressure is well-controlled on valsartan  80 mg daily. Continue valsartan .   Orders: -     Comprehensive metabolic panel with GFR     Return in about 6 months (around 01/12/2025) for Follow up.   Leron Glance, NP-C [facility] [place]    [1]  Social History Tobacco Use  Smoking Status Former   Current packs/day: 0.00   Average packs/day: 1.5 packs/day for 28.8 years (43.2 ttl pk-yrs)   Types: Cigarettes   Start date: 03/10/1987   Quit date: 01/03/2016   Years since quitting: 8.5  Smokeless Tobacco Never  [2]  Current  Outpatient Medications on File Prior to Visit  Medication Sig Dispense Refill   cyanocobalamin  (VITAMIN B12) 1000 MCG tablet Take 1 tablet (1,000 mcg total) by mouth daily. 90 tablet 3   escitalopram  (LEXAPRO ) 20 MG tablet Take 1.5 tablets (30 mg total) by mouth daily. 135 tablet 3   meloxicam  (MOBIC ) 7.5 MG tablet TAKE 1 TABLET BY MOUTH 2 TIMES A DAY AS NEEDED FOR PAIN 180 tablet 1   tadalafil  (CIALIS ) 5 MG tablet TAKE 1/2 TABLET BY MOUTH DAILY AS NEEDED FOR ERECTILE DYSFUNCTION 45 tablet 3   valsartan  (DIOVAN ) 80 MG tablet Take 1 tablet (80 mg total) by mouth daily. 90 tablet 3   Vitamin D , Ergocalciferol , (DRISDOL ) 1.25 MG (50000 UNIT) CAPS capsule Take 1 capsule (50,000 Units total) by mouth every 7 (seven) days. 13 capsule 1   No current facility-administered medications on file prior to visit.   "

## 2025-01-13 ENCOUNTER — Ambulatory Visit: Admitting: Nurse Practitioner
